# Patient Record
Sex: Female | Born: 1945
Health system: Southern US, Community
[De-identification: ages and names within clinical notes are randomized; demographics above are authoritative.]

## PROBLEM LIST (undated history)

## (undated) DIAGNOSIS — E785 Hyperlipidemia, unspecified: Secondary | ICD-10-CM

## (undated) DIAGNOSIS — M48061 Spinal stenosis, lumbar region without neurogenic claudication: Secondary | ICD-10-CM

## (undated) DIAGNOSIS — M542 Cervicalgia: Secondary | ICD-10-CM

## (undated) DIAGNOSIS — M4802 Spinal stenosis, cervical region: Secondary | ICD-10-CM

## (undated) DIAGNOSIS — E119 Type 2 diabetes mellitus without complications: Secondary | ICD-10-CM

## (undated) DIAGNOSIS — K219 Gastro-esophageal reflux disease without esophagitis: Secondary | ICD-10-CM

## (undated) DIAGNOSIS — K5792 Diverticulitis of intestine, part unspecified, without perforation or abscess without bleeding: Secondary | ICD-10-CM

## (undated) DIAGNOSIS — M62838 Other muscle spasm: Secondary | ICD-10-CM

## (undated) HISTORY — DX: Spinal stenosis, cervical region: M48.02

## (undated) HISTORY — DX: Type 2 diabetes mellitus without complications: E11.9

## (undated) HISTORY — DX: Diverticulitis of intestine, part unspecified, without perforation or abscess without bleeding: K57.92

## (undated) HISTORY — DX: Cervicalgia: M54.2

## (undated) HISTORY — DX: Other muscle spasm: M62.838

## (undated) HISTORY — PX: OVARIAN CYST REMOVAL: SHX89

## (undated) HISTORY — DX: Spinal stenosis, lumbar region without neurogenic claudication: M48.061

## (undated) HISTORY — DX: Hyperlipidemia, unspecified: E78.5

## (undated) HISTORY — DX: Gastro-esophageal reflux disease without esophagitis: K21.9

## (undated) HISTORY — PX: OTHER SURGICAL HISTORY: SHX169

---

## 1997-07-25 ENCOUNTER — Other Ambulatory Visit: Admission: RE | Admit: 1997-07-25 | Discharge: 1997-07-25 | Payer: Self-pay | Admitting: Obstetrics and Gynecology

## 1997-11-28 ENCOUNTER — Other Ambulatory Visit: Admission: RE | Admit: 1997-11-28 | Discharge: 1997-11-28 | Payer: Self-pay | Admitting: Obstetrics and Gynecology

## 1998-09-25 ENCOUNTER — Other Ambulatory Visit: Admission: RE | Admit: 1998-09-25 | Discharge: 1998-09-25 | Payer: Self-pay | Admitting: Obstetrics and Gynecology

## 1998-10-04 ENCOUNTER — Other Ambulatory Visit: Admission: RE | Admit: 1998-10-04 | Discharge: 1998-10-04 | Payer: Self-pay | Admitting: Obstetrics and Gynecology

## 1999-11-12 ENCOUNTER — Other Ambulatory Visit: Admission: RE | Admit: 1999-11-12 | Discharge: 1999-11-12 | Payer: Self-pay | Admitting: *Deleted

## 2000-02-21 ENCOUNTER — Ambulatory Visit (HOSPITAL_COMMUNITY): Admission: RE | Admit: 2000-02-21 | Discharge: 2000-02-21 | Payer: Self-pay | Admitting: Gastroenterology

## 2000-02-21 ENCOUNTER — Encounter: Payer: Self-pay | Admitting: Gastroenterology

## 2001-02-16 ENCOUNTER — Other Ambulatory Visit: Admission: RE | Admit: 2001-02-16 | Discharge: 2001-02-16 | Payer: Self-pay | Admitting: Family Medicine

## 2002-08-16 ENCOUNTER — Other Ambulatory Visit: Admission: RE | Admit: 2002-08-16 | Discharge: 2002-08-16 | Payer: Self-pay | Admitting: Family Medicine

## 2004-03-08 ENCOUNTER — Ambulatory Visit: Payer: Self-pay | Admitting: Family Medicine

## 2004-04-22 ENCOUNTER — Ambulatory Visit: Payer: Self-pay | Admitting: Family Medicine

## 2004-04-30 ENCOUNTER — Ambulatory Visit: Payer: Self-pay | Admitting: Family Medicine

## 2004-04-30 ENCOUNTER — Other Ambulatory Visit: Admission: RE | Admit: 2004-04-30 | Discharge: 2004-04-30 | Payer: Self-pay | Admitting: Family Medicine

## 2015-04-13 DIAGNOSIS — M6281 Muscle weakness (generalized): Secondary | ICD-10-CM | POA: Diagnosis not present

## 2015-04-13 DIAGNOSIS — M4722 Other spondylosis with radiculopathy, cervical region: Secondary | ICD-10-CM | POA: Diagnosis not present

## 2015-04-16 DIAGNOSIS — E119 Type 2 diabetes mellitus without complications: Secondary | ICD-10-CM | POA: Diagnosis not present

## 2015-04-16 DIAGNOSIS — E785 Hyperlipidemia, unspecified: Secondary | ICD-10-CM | POA: Diagnosis not present

## 2015-04-16 DIAGNOSIS — Z79899 Other long term (current) drug therapy: Secondary | ICD-10-CM | POA: Diagnosis not present

## 2015-04-16 DIAGNOSIS — E559 Vitamin D deficiency, unspecified: Secondary | ICD-10-CM | POA: Diagnosis not present

## 2015-04-17 DIAGNOSIS — M6281 Muscle weakness (generalized): Secondary | ICD-10-CM | POA: Diagnosis not present

## 2015-04-17 DIAGNOSIS — M4722 Other spondylosis with radiculopathy, cervical region: Secondary | ICD-10-CM | POA: Diagnosis not present

## 2015-04-19 DIAGNOSIS — M6281 Muscle weakness (generalized): Secondary | ICD-10-CM | POA: Diagnosis not present

## 2015-04-19 DIAGNOSIS — M4722 Other spondylosis with radiculopathy, cervical region: Secondary | ICD-10-CM | POA: Diagnosis not present

## 2015-04-23 DIAGNOSIS — E119 Type 2 diabetes mellitus without complications: Secondary | ICD-10-CM | POA: Diagnosis not present

## 2015-04-23 DIAGNOSIS — M2550 Pain in unspecified joint: Secondary | ICD-10-CM | POA: Diagnosis not present

## 2015-04-23 DIAGNOSIS — Z9181 History of falling: Secondary | ICD-10-CM | POA: Diagnosis not present

## 2015-04-23 DIAGNOSIS — Z1389 Encounter for screening for other disorder: Secondary | ICD-10-CM | POA: Diagnosis not present

## 2015-04-23 DIAGNOSIS — F39 Unspecified mood [affective] disorder: Secondary | ICD-10-CM | POA: Diagnosis not present

## 2015-04-23 DIAGNOSIS — I10 Essential (primary) hypertension: Secondary | ICD-10-CM | POA: Diagnosis not present

## 2015-04-23 DIAGNOSIS — Z681 Body mass index (BMI) 19 or less, adult: Secondary | ICD-10-CM | POA: Diagnosis not present

## 2015-04-23 DIAGNOSIS — E785 Hyperlipidemia, unspecified: Secondary | ICD-10-CM | POA: Diagnosis not present

## 2015-04-24 DIAGNOSIS — M4802 Spinal stenosis, cervical region: Secondary | ICD-10-CM | POA: Diagnosis not present

## 2015-04-24 DIAGNOSIS — M47812 Spondylosis without myelopathy or radiculopathy, cervical region: Secondary | ICD-10-CM | POA: Diagnosis not present

## 2015-04-24 DIAGNOSIS — M542 Cervicalgia: Secondary | ICD-10-CM | POA: Diagnosis not present

## 2015-05-03 DIAGNOSIS — Z23 Encounter for immunization: Secondary | ICD-10-CM | POA: Diagnosis not present

## 2015-05-07 ENCOUNTER — Encounter: Payer: Self-pay | Admitting: Neurology

## 2015-05-07 ENCOUNTER — Ambulatory Visit (INDEPENDENT_AMBULATORY_CARE_PROVIDER_SITE_OTHER): Payer: PPO | Admitting: Neurology

## 2015-05-07 VITALS — BP 138/73 | HR 82 | Ht 64.0 in | Wt 135.0 lb

## 2015-05-07 DIAGNOSIS — G243 Spasmodic torticollis: Secondary | ICD-10-CM

## 2015-05-07 NOTE — Progress Notes (Signed)
PATIENT: Cassandra Decker DOB: 01-05-1946  Chief Complaint  Patient presents with  . Neck Pain    Reports pain and spasms in her neck.  Pain radiates into the back of her head and into her bilateral shoulders.  She has been on multiple medications (meloxicam, hydrocodone, methocarbamol, duloxetine).  She is currently in physical therapy. She has tried epidural steroid injections and nerve blocks.     HISTORICAL  Cassandra Decker is a 70 years old right-handed female, alone at today's clinical visit, seen in refer by  Laroy Apple, MD her primary care Dr. Renelda Mom for evaluation of neck pain, abnormal neck posture  She had a history of hyperlipidemia, polymyalgia rheumatica, has been treated with prednisone 5 mg daily for few years, borderline diabetes, she works as Glass blower/designer for [employer]  Around 2010, she began to noticed neck discomfort, she tends to hold her neck to the right side, gradually getting worse, to the point of difficulty keeping her neck straight, tendency of head titubation, constant posterior neck pain, difficulty turning her neck to the left side, she also developed bilateral shoulder pain. She has difficulty driving, difficulty turning.  She has to hold her chin frequently to keep her neck in a straight position, she denies gait difficulty, no bowel and bladder incontinence.   She has gone through physical therapy, chiropractor, epidural injection without significant improvement   She had MRI of cervical spine at orthopedic clinic in August 2016, multilevel cervical degenerative changes, most severe at C5-6, with mild-to-moderate canal stenosis, no cord signal changes, was not able to reviewed the film   REVIEW OF SYSTEMS: Full 14 system review of systems performed and notable only foras above  ALLERGIES: Allergies  Allergen Reactions  . Aspirin Swelling  . Sulfa Antibiotics Hives    HOME MEDICATIONS: Current Outpatient Prescriptions  Medication  Sig Dispense Refill  . alendronate (FOSAMAX) 70 MG tablet Take 70 mg by mouth once a week. Take with a full glass of water on an empty stomach.    . Calcium Citrate-Vitamin D (CALCIUM CITRATE + D PO) Take by mouth daily.    . Cholecalciferol (VITAMIN D PO) Take by mouth daily.    . DULoxetine (CYMBALTA) 60 MG capsule Take 60 mg by mouth daily.    . Glucosamine-MSM-Hyaluronic Acd (JOINT HEALTH PO) Take by mouth.    Marland Kitchen HYDROcodone-acetaminophen (NORCO/VICODIN) 5-325 MG tablet Take 1 tablet by mouth as needed for moderate pain.    Marland Kitchen LACTOBACILLUS PO Take by mouth daily.    Marland Kitchen loratadine (CLARITIN) 10 MG tablet Take 10 mg by mouth as needed for allergies.    . LUTEIN PO Take by mouth daily.    Marland Kitchen MAGNESIUM PO Take by mouth daily.    . meloxicam (MOBIC) 7.5 MG tablet Take 7.5 mg by mouth as needed for pain.    . methocarbamol (ROBAXIN) 500 MG tablet Take 500 mg by mouth as needed for muscle spasms.    Marland Kitchen omeprazole (PRILOSEC) 20 MG capsule Take 20 mg by mouth daily.    . predniSONE (DELTASONE) 5 MG tablet Take 5 mg by mouth daily with breakfast.    . rosuvastatin (CRESTOR) 10 MG tablet Take 10 mg by mouth daily.    Marland Kitchen UNABLE TO FIND daily. Med Name: [place]     No current facility-administered medications for this visit.    PAST MEDICAL HISTORY: Past Medical History  Diagnosis Date  . Diabetes ([place])  Borderline - A1C 6.5  . Neck pain   . Muscle spasms of neck   . Cervical stenosis of spine     Past treament with ESI and nerve blocks.  . Lumbar stenosis     Past treatment with ESIs.  . Diverticulitis   . Hyperlipemia   . Acid reflux     PAST SURGICAL HISTORY: Past Surgical History  Procedure Laterality Date  . Ovarian cyst removal    . Colon surgery      Diverticulitis    FAMILY HISTORY: Family History  Problem Relation Age of Onset  . Stroke Mother   . Emphysema Father   . Cancer Paternal Grandfather     oral    SOCIAL HISTORY:  Social History    Social History  . Marital Status: Single    Spouse Name: N/A  . Number of Children: 2  . Years of Education: 14   Occupational History  . Glass blower/designer    Social History Main Topics  . Smoking status: Never Smoker   . Smokeless tobacco: Not on file  . Alcohol Use: No  . Drug Use: No  . Sexual Activity: Not on file   Other Topics Concern  . Not on file   Social History Narrative   Lives at home with husband.   Right-handed.   1-2 cup caffeine per day.     PHYSICAL EXAM   Filed Vitals:   05/07/15 1317  BP: 138/73  Pulse: 82  Height: 5\' 4"  (1.626 m)  Weight: 135 lb (61.236 kg)    Not recorded      Body mass index is 23.16 kg/(m^2).  PHYSICAL EXAMNIATION:  Gen: NAD, conversant, well nourised, obese, well groomed                     Cardiovascular: Regular rate rhythm, no peripheral edema, warm, nontender. Eyes: Conjunctivae clear without exudates or hemorrhage Neck: Supple, no carotid bruise. Pulmonary: Clear to auscultation bilaterally   NEUROLOGICAL EXAM:  MENTAL STATUS: Speech:    Speech is normal; fluent and spontaneous with normal comprehension.  Cognition:     Orientation to time, place and person     Normal recent and remote memory     Normal Attention span and concentration     Normal Language, naming, repeating,spontaneous speech     Fund of knowledge   CRANIAL NERVES: CN II: Visual fields are full to confrontation. Fundoscopic exam is normal with sharp discs and no vascular changes. Pupils are round equal and briskly reactive to light. CN III, IV, VI: extraocular movement are normal. No ptosis. CN V: Facial sensation is intact to pinprick in all 3 divisions bilaterally. Corneal responses are intact.  CN VII: Face is symmetric with normal eye closure and smile. CN VIII: Hearing is normal to rubbing fingers CN IX, X: Palate elevates symmetrically. Phonation is normal. CN XI: Head turning and shoulder shrug are intact CN XII: Tongue is  midline with normal movements and no atrophy.  MOTOR: She has constant head tilt movement, mild left shoulder elevation ,moderate anterocollis, right turn, left tilt   There is no pronator drift of out-stretched arms. Muscle bulk and tone are normal. Muscle strength is normal.  REFLEXES: Reflexes are 2+ and symmetric at the biceps, triceps, knees, and ankles. Plantar responses are flexor.  SENSORY: Intact to light touch, pinprick, position sense, and vibration sense are intact in fingers and toes.  COORDINATION: Rapid alternating movements and fine finger movements are  intact. There is no dysmetria on finger-to-nose and heel-knee-shin.    GAIT/STANCE: Posture is normal. Gait is steady with normal steps, base, arm swing, and turning. Heel and toe walking are normal. Tandem gait is normal.  Romberg is absent.   DIAGNOSTIC DATA (LABS, IMAGING, TESTING) - I reviewed patient records, labs, notes, testing and imaging myself where available.   ASSESSMENT AND PLAN  Kennidy Wyszynski Manfra is a 70 y.o. female    Cervical dystonia:  She has constant head yes yes titubation, mild left shoulder elevation ,moderate anterocollis, moderate right turn, left tilt   There was no evidence of cervical myelopathy  Bring MRI cervical CD to reveal  Preauthorization paperwork for EMG guided botulism toxin injection    Marcial Pacas, M.D. Ph.D.  [facility] [address] [place],  [address] Ph: [phone] Fax: [phone]  CC:Laroy Apple, MD. Dr. Nicholos Johns

## 2015-05-16 DIAGNOSIS — M6281 Muscle weakness (generalized): Secondary | ICD-10-CM | POA: Diagnosis not present

## 2015-05-16 DIAGNOSIS — M4722 Other spondylosis with radiculopathy, cervical region: Secondary | ICD-10-CM | POA: Diagnosis not present

## 2015-05-21 ENCOUNTER — Telehealth: Payer: Self-pay | Admitting: Neurology

## 2015-05-21 DIAGNOSIS — M6281 Muscle weakness (generalized): Secondary | ICD-10-CM | POA: Diagnosis not present

## 2015-05-21 DIAGNOSIS — M4722 Other spondylosis with radiculopathy, cervical region: Secondary | ICD-10-CM | POA: Diagnosis not present

## 2015-05-21 NOTE — Telephone Encounter (Signed)
[facility] called and needs to verify policy # . Please call and advise [phone] option 4 .

## 2015-05-23 DIAGNOSIS — M4722 Other spondylosis with radiculopathy, cervical region: Secondary | ICD-10-CM | POA: Diagnosis not present

## 2015-05-23 DIAGNOSIS — M6281 Muscle weakness (generalized): Secondary | ICD-10-CM | POA: Diagnosis not present

## 2015-05-31 ENCOUNTER — Telehealth: Payer: Self-pay | Admitting: Neurology

## 2015-05-31 NOTE — Telephone Encounter (Signed)
Pt called and wanted an update about her insurance coverage for botox injections. Please call and advise [phone]

## 2015-05-31 NOTE — Telephone Encounter (Signed)
Called and spoke to patient and relayed Cassandra Decker is out of the office and she should return on Monday March 6th . Patient relayed she was fine to wait . Patient relayed she is ready for her injection.

## 2015-06-04 DIAGNOSIS — Z1231 Encounter for screening mammogram for malignant neoplasm of breast: Secondary | ICD-10-CM | POA: Diagnosis not present

## 2015-06-04 DIAGNOSIS — M4722 Other spondylosis with radiculopathy, cervical region: Secondary | ICD-10-CM | POA: Diagnosis not present

## 2015-06-04 DIAGNOSIS — M6281 Muscle weakness (generalized): Secondary | ICD-10-CM | POA: Diagnosis not present

## 2015-06-06 DIAGNOSIS — M6281 Muscle weakness (generalized): Secondary | ICD-10-CM | POA: Diagnosis not present

## 2015-06-06 DIAGNOSIS — M4722 Other spondylosis with radiculopathy, cervical region: Secondary | ICD-10-CM | POA: Diagnosis not present

## 2015-06-06 NOTE — Telephone Encounter (Signed)
Patient has been scheduled

## 2015-06-08 DIAGNOSIS — Z6823 Body mass index (BMI) 23.0-23.9, adult: Secondary | ICD-10-CM | POA: Diagnosis not present

## 2015-06-08 DIAGNOSIS — J069 Acute upper respiratory infection, unspecified: Secondary | ICD-10-CM | POA: Diagnosis not present

## 2015-06-13 DIAGNOSIS — M4722 Other spondylosis with radiculopathy, cervical region: Secondary | ICD-10-CM | POA: Diagnosis not present

## 2015-06-13 DIAGNOSIS — M6281 Muscle weakness (generalized): Secondary | ICD-10-CM | POA: Diagnosis not present

## 2015-06-20 DIAGNOSIS — M4722 Other spondylosis with radiculopathy, cervical region: Secondary | ICD-10-CM | POA: Diagnosis not present

## 2015-06-20 DIAGNOSIS — M6281 Muscle weakness (generalized): Secondary | ICD-10-CM | POA: Diagnosis not present

## 2015-06-25 ENCOUNTER — Telehealth: Payer: Self-pay | Admitting: Neurology

## 2015-06-25 NOTE — Telephone Encounter (Signed)
Cassandra Decker with [facility] [phone] ext [phone] called said shipment for Xeomin could be shipped by tomorrow's appt . Said it cold be here by Wednesday 06/27/15. Please call asap!

## 2015-06-25 NOTE — Telephone Encounter (Signed)
Claiborne Billings with [facility] is calling and states she has questions about the patient in order to fill Rx Xeomin.  Please call.  Thanks!

## 2015-06-25 NOTE — Telephone Encounter (Signed)
Spoke with the pharmacy and they stated they have been trying to reach patient for consent.

## 2015-06-25 NOTE — Telephone Encounter (Signed)
Patient is calling and states that the pharmacy has called her and that they don't believe her medication will be here in time for her appointment tomorrow.  Please call patient [phone].

## 2015-06-26 ENCOUNTER — Ambulatory Visit (INDEPENDENT_AMBULATORY_CARE_PROVIDER_SITE_OTHER): Payer: PPO | Admitting: Neurology

## 2015-06-26 ENCOUNTER — Encounter: Payer: Self-pay | Admitting: Neurology

## 2015-06-26 VITALS — Ht 64.0 in

## 2015-06-26 DIAGNOSIS — G243 Spasmodic torticollis: Secondary | ICD-10-CM

## 2015-06-26 NOTE — Progress Notes (Signed)
**  Xeomin 100 units x 2 vials, Lot [id number], Exp 05/2016, [place] [id number], specialty pharmacy**mck,rn.

## 2015-06-26 NOTE — Telephone Encounter (Signed)
Spoke with pharmacy. It has been taken care of it.

## 2015-06-26 NOTE — Telephone Encounter (Signed)
Spoke with the patient and confirmed apt time.

## 2015-06-26 NOTE — Progress Notes (Signed)
PATIENT: Cassandra Decker DOB: Dec 17, 1945  Chief Complaint  Patient presents with  . Cervical Dystonia    Xeomin 200 units - specialty pharmacy     HISTORICAL  Cassandra Decker is a 70 years old right-handed female, alone at today's clinical visit, seen in refer by  Laroy Apple, her primary care Dr. Renelda Mom for evaluation of neck pain, abnormal neck posture  She had a history of hyperlipidemia, polymyalgia rheumatica, has been treated with prednisone 5 mg daily for few years, borderline diabetes, she works as Glass blower/designer for [employer]  Around 2010, she began to noticed neck discomfort, she tends to hold her neck to the right side, gradually getting worse, to the point of difficulty keeping her neck straight, tendency of head titubation, constant posterior neck pain, difficulty turning her neck to the left side, she also developed bilateral shoulder pain. She has difficulty driving, difficulty turning.  She has to hold her chin frequently to keep her neck in a straight position, she denies gait difficulty, no bowel and bladder incontinence.   She has gone through physical therapy, chiropractor, epidural injection without significant improvement   She had MRI of cervical spine at orthopedic clinic in August 2016, multilevel cervical degenerative changes, most severe at C5-6, with mild-to-moderate canal stenosis, no cord signal changes, was not able to reviewed the film  UPDATE March 28th 2017: We have personally reviewed MRI of cervical spine August 2016: Moderate multilevel cervical disc degeneration with moderate<BR>spinal stenosis at C5-6.  Mild spinal stenosis at C6-7 3. Mild-to-moderate multilevel foraminal stenosis as above.  Potential side effect of xeomin was explained to the patient, this is her first EMG guided xeomin injection   REVIEW OF SYSTEMS: Full 14 system review of systems performed and notable only foras above  ALLERGIES: Allergies  Allergen Reactions    . Aspirin Swelling  . Sulfa Antibiotics Hives    HOME MEDICATIONS: Current Outpatient Prescriptions  Medication Sig Dispense Refill  . alendronate (FOSAMAX) 70 MG tablet Take 70 mg by mouth once a week. Take with a full glass of water on an empty stomach.    . Calcium Citrate-Vitamin D (CALCIUM CITRATE + D PO) Take by mouth daily.    . Cholecalciferol (VITAMIN D PO) Take by mouth daily.    . DULoxetine (CYMBALTA) 60 MG capsule Take 60 mg by mouth daily.    . Glucosamine-MSM-Hyaluronic Acd (JOINT HEALTH PO) Take by mouth.    Marland Kitchen HYDROcodone-acetaminophen (NORCO/VICODIN) 5-325 MG tablet Take 1 tablet by mouth as needed for moderate pain.    Marland Kitchen LACTOBACILLUS PO Take by mouth daily.    Marland Kitchen loratadine (CLARITIN) 10 MG tablet Take 10 mg by mouth as needed for allergies.    . LUTEIN PO Take by mouth daily.    Marland Kitchen MAGNESIUM PO Take by mouth daily.    . meloxicam (MOBIC) 7.5 MG tablet Take 7.5 mg by mouth as needed for pain.    . methocarbamol (ROBAXIN) 500 MG tablet Take 500 mg by mouth as needed for muscle spasms.    Marland Kitchen omeprazole (PRILOSEC) 20 MG capsule Take 20 mg by mouth daily.    . predniSONE (DELTASONE) 5 MG tablet Take 5 mg by mouth daily with breakfast.    . rosuvastatin (CRESTOR) 10 MG tablet Take 10 mg by mouth daily.    Marland Kitchen UNABLE TO FIND daily. Med Name: [place]     No current facility-administered medications for this visit.    PAST  MEDICAL HISTORY: Past Medical History  Diagnosis Date  . Diabetes ([facility])     Borderline - A1C 6.5  . Neck pain   . Muscle spasms of neck   . Cervical stenosis of spine     Past treament with ESI and nerve blocks.  . Lumbar stenosis     Past treatment with ESIs.  . Diverticulitis   . Hyperlipemia   . Acid reflux     PAST SURGICAL HISTORY: Past Surgical History  Procedure Laterality Date  . Ovarian cyst removal    . Colon surgery      Diverticulitis    FAMILY HISTORY: Family History  Problem Relation Age of Onset  . Stroke  Mother   . Emphysema Father   . Cancer Paternal Grandfather     oral    SOCIAL HISTORY:  Social History   Social History  . Marital Status: Single    Spouse Name: N/A  . Number of Children: 2  . Years of Education: 14   Occupational History  . Glass blower/designer    Social History Main Topics  . Smoking status: Never Smoker   . Smokeless tobacco: Not on file  . Alcohol Use: No  . Drug Use: No  . Sexual Activity: Not on file   Other Topics Concern  . Not on file   Social History Narrative   Lives at home with husband.   Right-handed.   1-2 cup caffeine per day.     PHYSICAL EXAM   Filed Vitals:   06/26/15 0746  Height: 5\' 4"  (1.626 m)    Not recorded      There is no weight on file to calculate BMI.  PHYSICAL EXAMNIATION:  Gen: NAD, conversant, well nourised, obese, well groomed                     Cardiovascular: Regular rate rhythm, no peripheral edema, warm, nontender. Eyes: Conjunctivae clear without exudates or hemorrhage Neck: Supple, no carotid bruise. Pulmonary: Clear to auscultation bilaterally   NEUROLOGICAL EXAM:  MENTAL STATUS: Speech:    Speech is normal; fluent and spontaneous with normal comprehension.  Cognition:     Orientation to time, place and person     Normal recent and remote memory     Normal Attention span and concentration     Normal Language, naming, repeating,spontaneous speech     Fund of knowledge   CRANIAL NERVES: CN II: Visual fields are full to confrontation. Fundoscopic exam is normal with sharp discs and no vascular changes. Pupils are round equal and briskly reactive to light. CN III, IV, VI: extraocular movement are normal. No ptosis. CN V: Facial sensation is intact to pinprick in all 3 divisions bilaterally. Corneal responses are intact.  CN VII: Face is symmetric with normal eye closure and smile. CN VIII: Hearing is normal to rubbing fingers CN IX, X: Palate elevates symmetrically. Phonation is normal. CN  XI: Head turning and shoulder shrug are intact CN XII: Tongue is midline with normal movements and no atrophy.  MOTOR: She has constant head tilt movement, mild left shoulder elevation ,moderate anterocollis, right turn, right tilt  There is no pronator drift of out-stretched arms. Muscle bulk and tone are normal. Muscle strength is normal.  REFLEXES: Reflexes are 2+ and symmetric at the biceps, triceps, knees, and ankles. Plantar responses are flexor.  SENSORY: Intact to light touch, pinprick, position sense, and vibration sense are intact in fingers and toes.  COORDINATION: Rapid alternating  movements and fine finger movements are intact. There is no dysmetria on finger-to-nose and heel-knee-shin.    GAIT/STANCE: Posture is normal. Gait is steady with normal steps, base, arm swing, and turning. Heel and toe walking are normal. Tandem gait is normal.  Romberg is absent.   DIAGNOSTIC DATA (LABS, IMAGING, TESTING) - I reviewed patient records, labs, notes, testing and imaging myself where available.   ASSESSMENT AND PLAN  Cassandra Decker is a 70 y.o. female    Cervical dystonia: She has constant head yes yes titubation, mild left shoulder elevation ,moderate anterocollis, moderate right turn, right tilt This is her first EMG guided xeomin injection in March 28th 2017  Used Xeomin 200 units (100units/2cc NS) under EMG guidance  Left sternocleidomastoid 25 units Left levator scapular 25 units  Right longissimus 25 units Right splenius capitis 25 units Right splenius cervix 25 units  Right iliocostalis 25 units Right semispinalis 25 units Right scalenes posterior 25 units  She tolerated the injection well, Will increase her xeomin injection to 300 units in next injection in June 2017    Marcial Pacas, M.D. Ph.D.  [facility] [address] [place], [place] [address] Ph: [phone] Fax: [phone]  CC:Laroy Apple, MD. Dr. Nicholos Johns

## 2015-07-04 DIAGNOSIS — M4722 Other spondylosis with radiculopathy, cervical region: Secondary | ICD-10-CM | POA: Diagnosis not present

## 2015-07-04 DIAGNOSIS — M6281 Muscle weakness (generalized): Secondary | ICD-10-CM | POA: Diagnosis not present

## 2015-07-11 DIAGNOSIS — M6281 Muscle weakness (generalized): Secondary | ICD-10-CM | POA: Diagnosis not present

## 2015-07-11 DIAGNOSIS — M4722 Other spondylosis with radiculopathy, cervical region: Secondary | ICD-10-CM | POA: Diagnosis not present

## 2015-08-21 DIAGNOSIS — I10 Essential (primary) hypertension: Secondary | ICD-10-CM | POA: Diagnosis not present

## 2015-08-21 DIAGNOSIS — E785 Hyperlipidemia, unspecified: Secondary | ICD-10-CM | POA: Diagnosis not present

## 2015-08-21 DIAGNOSIS — M353 Polymyalgia rheumatica: Secondary | ICD-10-CM | POA: Diagnosis not present

## 2015-08-21 DIAGNOSIS — N951 Menopausal and female climacteric states: Secondary | ICD-10-CM | POA: Diagnosis not present

## 2015-08-21 DIAGNOSIS — F39 Unspecified mood [affective] disorder: Secondary | ICD-10-CM | POA: Diagnosis not present

## 2015-08-21 DIAGNOSIS — M5440 Lumbago with sciatica, unspecified side: Secondary | ICD-10-CM | POA: Diagnosis not present

## 2015-09-27 ENCOUNTER — Ambulatory Visit (INDEPENDENT_AMBULATORY_CARE_PROVIDER_SITE_OTHER): Payer: PPO | Admitting: Neurology

## 2015-09-27 ENCOUNTER — Telehealth: Payer: Self-pay | Admitting: *Deleted

## 2015-09-27 ENCOUNTER — Encounter: Payer: Self-pay | Admitting: Neurology

## 2015-09-27 VITALS — BP 126/74 | HR 60 | Ht 64.0 in

## 2015-09-27 DIAGNOSIS — G243 Spasmodic torticollis: Secondary | ICD-10-CM | POA: Diagnosis not present

## 2015-09-27 NOTE — Progress Notes (Signed)
**  Xeomin 100 units x 2 vials, Lot [id number], Exp 05/2017, [place] [id number], specialty pharmacy.**mck,rn.

## 2015-09-27 NOTE — Progress Notes (Signed)
PATIENT: Cassandra Decker DOB: 1945/05/21  Chief Complaint  Patient presents with  . Cervical Dystonia    Xeomin 300 units - specialty pharmacy     HISTORICAL  Cassandra Decker is a 70 years old right-handed female, alone at today's clinical visit, seen in refer by  Laroy Apple, her primary care Dr. Renelda Mom for evaluation of neck pain, abnormal neck posture  She had a history of hyperlipidemia, polymyalgia rheumatica, has been treated with prednisone 5 mg daily for few years, borderline diabetes, she works as Glass blower/designer for [employer]  Around 2010, she began to noticed neck discomfort, she tends to hold her neck to the right side, gradually getting worse, to the point of difficulty keeping her neck straight, tendency of head titubation, constant posterior neck pain, difficulty turning her neck to the left side, she also developed bilateral shoulder pain. She has difficulty driving, difficulty turning.  She has to hold her chin frequently to keep her neck in a straight position, she denies gait difficulty, no bowel and bladder incontinence.   She has gone through physical therapy, chiropractor, epidural injection without significant improvement   She had MRI of cervical spine at orthopedic clinic in August 2016, multilevel cervical degenerative changes, most severe at C5-6, with mild-to-moderate canal stenosis, no cord signal changes, was not able to reviewed the film  UPDATE March 28th 2017: We have personally reviewed MRI of cervical spine August 2016: Moderate multilevel cervical disc degeneration with moderate<BR>spinal stenosis at C5-6.  Mild spinal stenosis at C6-7 3. Mild-to-moderate multilevel foraminal stenosis as above.  Potential side effect of xeomin was explained to the patient, this is her first EMG guided xeomin injection  UPDATE June 29th 2017: She responded to her initial xeomin injection in March 28 very well, we used 200 units of xeomin, no significant  side effect, took about 2 weeks to take effect, the benefit lasts about 10 weeks, in the past 2 weeks, she noticed recurrent symptoms, neck pulling to her right side   REVIEW OF SYSTEMS: Full 14 system review of systems performed and notable only foras above  ALLERGIES: Allergies  Allergen Reactions  . Aspirin Swelling  . Sulfa Antibiotics Hives    HOME MEDICATIONS: Current Outpatient Prescriptions  Medication Sig Dispense Refill  . alendronate (FOSAMAX) 70 MG tablet Take 70 mg by mouth once a week. Take with a full glass of water on an empty stomach.    . Calcium Citrate-Vitamin D (CALCIUM CITRATE + D PO) Take by mouth daily.    . Cholecalciferol (VITAMIN D PO) Take by mouth daily.    . DULoxetine (CYMBALTA) 60 MG capsule Take 60 mg by mouth daily.    . Glucosamine-MSM-Hyaluronic Acd (JOINT HEALTH PO) Take by mouth.    Marland Kitchen HYDROcodone-acetaminophen (NORCO/VICODIN) 5-325 MG tablet Take 1 tablet by mouth as needed for moderate pain.    Marland Kitchen LACTOBACILLUS PO Take by mouth daily.    Marland Kitchen loratadine (CLARITIN) 10 MG tablet Take 10 mg by mouth as needed for allergies.    . LUTEIN PO Take by mouth daily.    Marland Kitchen MAGNESIUM PO Take by mouth daily.    . meloxicam (MOBIC) 7.5 MG tablet Take 7.5 mg by mouth as needed for pain.    . methocarbamol (ROBAXIN) 500 MG tablet Take 500 mg by mouth as needed for muscle spasms.    Marland Kitchen omeprazole (PRILOSEC) 20 MG capsule Take 20 mg by mouth daily.    . predniSONE (DELTASONE)  5 MG tablet Take 5 mg by mouth daily with breakfast.    . rosuvastatin (CRESTOR) 10 MG tablet Take 10 mg by mouth daily.    Marland Kitchen UNABLE TO FIND daily. Med Name: [place]     No current facility-administered medications for this visit.    PAST MEDICAL HISTORY: Past Medical History  Diagnosis Date  . Diabetes (HCC)     Borderline - A1C 6.5  . Neck pain   . Muscle spasms of neck   . Cervical stenosis of spine     Past treament with ESI and nerve blocks.  . Lumbar stenosis      Past treatment with ESIs.  . Diverticulitis   . Hyperlipemia   . Acid reflux     PAST SURGICAL HISTORY: Past Surgical History  Procedure Laterality Date  . Ovarian cyst removal    . Colon surgery      Diverticulitis    FAMILY HISTORY: Family History  Problem Relation Age of Onset  . Stroke Mother   . Emphysema Father   . Cancer Paternal Grandfather     oral    SOCIAL HISTORY:  Social History   Social History  . Marital Status: Single    Spouse Name: N/A  . Number of Children: 2  . Years of Education: 14   Occupational History  . Glass blower/designer    Social History Main Topics  . Smoking status: Never Smoker   . Smokeless tobacco: Not on file  . Alcohol Use: No  . Drug Use: No  . Sexual Activity: Not on file   Other Topics Concern  . Not on file   Social History Narrative   Lives at home with husband.   Right-handed.   1-2 cup caffeine per day.     PHYSICAL EXAM   Filed Vitals:   09/27/15 1136  Height: 5\' 4"  (1.626 m)    Not recorded      There is no weight on file to calculate BMI.  PHYSICAL EXAMNIATION:  Gen: NAD, conversant, well nourised, obese, well groomed                     Cardiovascular: Regular rate rhythm, no peripheral edema, warm, nontender. Eyes: Conjunctivae clear without exudates or hemorrhage Neck: Supple, no carotid bruise. Pulmonary: Clear to auscultation bilaterally   NEUROLOGICAL EXAM:  MENTAL STATUS: Speech:    Speech is normal; fluent and spontaneous with normal comprehension.  Cognition:     Orientation to time, place and person     Normal recent and remote memory     Normal Attention span and concentration     Normal Language, naming, repeating,spontaneous speech     Fund of knowledge   CRANIAL NERVES: CN II: Visual fields are full to confrontation. Fundoscopic exam is normal with sharp discs and no vascular changes. Pupils are round equal and briskly reactive to light. CN III, IV, VI: extraocular movement  are normal. No ptosis. CN V: Facial sensation is intact to pinprick in all 3 divisions bilaterally. Corneal responses are intact.  CN VII: Face is symmetric with normal eye closure and smile. CN VIII: Hearing is normal to rubbing fingers CN IX, X: Palate elevates symmetrically. Phonation is normal. CN XI: Head turning and shoulder shrug are intact CN XII: Tongue is midline with normal movements and no atrophy.  MOTOR: Her chin was pulling to her right shoulder, with moderate anterocollis, left tilt, right turn, mild left shoulder elevation.  There is no pronator drift of out-stretched arms. Muscle bulk and tone are normal. Muscle strength is normal.  REFLEXES: Reflexes are 2+ and symmetric at the biceps, triceps, knees, and ankles. Plantar responses are flexor.  SENSORY: Intact to light touch, pinprick, position sense, and vibration sense are intact in fingers and toes.  COORDINATION: Rapid alternating movements and fine finger movements are intact. There is no dysmetria on finger-to-nose and heel-knee-shin.    GAIT/STANCE: Posture is normal. Gait is steady with normal steps, base, arm swing, and turning. Heel and toe walking are normal. Tandem gait is normal.  Romberg is absent.   DIAGNOSTIC DATA (LABS, IMAGING, TESTING) - I reviewed patient records, labs, notes, testing and imaging myself where available.   ASSESSMENT AND PLAN  Cassandra Decker is a 70 y.o. female    Cervical dystonia: She has constant head yes yes titubation, mild left shoulder elevation ,moderate anterocollis, moderate right turn, right tilt This is her first EMG guided xeomin injection in March 28th 2017  Used Xeomin 200 units (100units/2cc NS) under EMG guidance  Left sternocleidomastoid 25 unitsx2=50 units Left levator scapular 25 units   Right longissimus 25 units Right splenius capitis 25 units Right splenius cervix 25 units Right levator scapular 25 units Right semispinalis 20 units  Under  EMG guidance also did small amount 2.5 units to right orbicularis oculi, 2.5 units to left orbicularis oculi to decrease muscle fasciculations  She tolerated the injection well, Will increase her xeomin injection to 300 units in next injection in Sept 2017    Marcial Pacas, M.D. Ph.D.  [facility] [address] [place], [place] [address] Ph: [phone] Fax: [phone]  CC:Laroy Apple, MD. Dr. Nicholos Johns

## 2015-09-27 NOTE — Progress Notes (Deleted)
Subjective:    Patient ID: Cassandra Decker is a 70 y.o. female.  HPI {Common ambulatory SmartLinks:[id number]}  Review of Systems  Objective:  Neurologic Exam  Physical Exam  Assessment:   ***  Plan:   ***

## 2015-09-27 NOTE — Telephone Encounter (Signed)
Patient's next injection in 11/2015 will be for Xeomin 300 units.

## 2015-10-03 DIAGNOSIS — I10 Essential (primary) hypertension: Secondary | ICD-10-CM | POA: Diagnosis not present

## 2015-10-03 DIAGNOSIS — J209 Acute bronchitis, unspecified: Secondary | ICD-10-CM | POA: Diagnosis not present

## 2015-10-03 DIAGNOSIS — J029 Acute pharyngitis, unspecified: Secondary | ICD-10-CM | POA: Diagnosis not present

## 2015-10-03 DIAGNOSIS — J019 Acute sinusitis, unspecified: Secondary | ICD-10-CM | POA: Diagnosis not present

## 2015-11-19 DIAGNOSIS — E119 Type 2 diabetes mellitus without complications: Secondary | ICD-10-CM | POA: Diagnosis not present

## 2015-11-19 DIAGNOSIS — E785 Hyperlipidemia, unspecified: Secondary | ICD-10-CM | POA: Diagnosis not present

## 2015-11-19 DIAGNOSIS — E559 Vitamin D deficiency, unspecified: Secondary | ICD-10-CM | POA: Diagnosis not present

## 2015-11-19 DIAGNOSIS — Z79899 Other long term (current) drug therapy: Secondary | ICD-10-CM | POA: Diagnosis not present

## 2015-11-27 DIAGNOSIS — M2550 Pain in unspecified joint: Secondary | ICD-10-CM | POA: Diagnosis not present

## 2015-11-27 DIAGNOSIS — M5440 Lumbago with sciatica, unspecified side: Secondary | ICD-10-CM | POA: Diagnosis not present

## 2015-11-27 DIAGNOSIS — I10 Essential (primary) hypertension: Secondary | ICD-10-CM | POA: Diagnosis not present

## 2015-11-27 DIAGNOSIS — M353 Polymyalgia rheumatica: Secondary | ICD-10-CM | POA: Diagnosis not present

## 2015-11-27 DIAGNOSIS — E785 Hyperlipidemia, unspecified: Secondary | ICD-10-CM | POA: Diagnosis not present

## 2015-11-27 DIAGNOSIS — E119 Type 2 diabetes mellitus without complications: Secondary | ICD-10-CM | POA: Diagnosis not present

## 2015-11-27 DIAGNOSIS — L989 Disorder of the skin and subcutaneous tissue, unspecified: Secondary | ICD-10-CM | POA: Diagnosis not present

## 2015-12-04 ENCOUNTER — Telehealth: Payer: Self-pay | Admitting: Neurology

## 2015-12-04 NOTE — Telephone Encounter (Signed)
Sarah with [facility] is calling to get a refill for medication Xeomin 100 units for the patient.

## 2015-12-07 NOTE — Telephone Encounter (Signed)
Called and gave verbal to pharmacist for Xeomin  For 300 units . Relayed Patient's apt October 5th . Pharmacy will contact patient and contact us back after they speak to patient to set up delivery of Xeomin.

## 2015-12-13 DIAGNOSIS — M1712 Unilateral primary osteoarthritis, left knee: Secondary | ICD-10-CM | POA: Diagnosis not present

## 2015-12-13 DIAGNOSIS — M1711 Unilateral primary osteoarthritis, right knee: Secondary | ICD-10-CM | POA: Diagnosis not present

## 2015-12-14 NOTE — Telephone Encounter (Signed)
[facility] [phone] called to set up delivery of Xeomin

## 2015-12-18 NOTE — Telephone Encounter (Signed)
Xeomin TBD 12/19/2015.

## 2016-01-03 ENCOUNTER — Encounter: Payer: Self-pay | Admitting: Neurology

## 2016-01-03 ENCOUNTER — Telehealth: Payer: Self-pay | Admitting: Neurology

## 2016-01-03 ENCOUNTER — Ambulatory Visit (INDEPENDENT_AMBULATORY_CARE_PROVIDER_SITE_OTHER): Payer: PPO | Admitting: Neurology

## 2016-01-03 VITALS — BP 140/80 | HR 88 | Ht 64.0 in | Wt 141.5 lb

## 2016-01-03 DIAGNOSIS — G243 Spasmodic torticollis: Secondary | ICD-10-CM

## 2016-01-03 NOTE — Telephone Encounter (Signed)
Pt need xeomin in Jan 2018. Her auth ends 03/30/16. Will we contact her ins for 2018? cb

## 2016-01-03 NOTE — Progress Notes (Signed)
PATIENT: Cassandra Decker DOB: February 12, 1946  Chief Complaint  Patient presents with  . Cervical Dystonia    Xeomin 300 units - specialty pharmacy.     HISTORICAL  Cassandra Decker is a 70 years old right-handed female, alone at today's clinical visit, seen in refer by  Laroy Apple, her primary care Dr. Renelda Mom for evaluation of neck pain, abnormal neck posture  She had a history of hyperlipidemia, polymyalgia rheumatica, has been treated with prednisone 5 mg daily for few years, borderline diabetes, she works as Glass blower/designer for [employer]  Around 2010, she began to noticed neck discomfort, she tends to hold her neck to the right side, gradually getting worse, to the point of difficulty keeping her neck straight, tendency of head titubation, constant posterior neck pain, difficulty turning her neck to the left side, she also developed bilateral shoulder pain. She has difficulty driving, difficulty turning.  She has to hold her chin frequently to keep her neck in a straight position, she denies gait difficulty, no bowel and bladder incontinence.   She has gone through physical therapy, chiropractor, epidural injection without significant improvement   She had MRI of cervical spine at orthopedic clinic in August 2016, multilevel cervical degenerative changes, most severe at C5-6, with mild-to-moderate canal stenosis, no cord signal changes, was not able to reviewed the film  UPDATE March 28th 2017: We have personally reviewed MRI of cervical spine August 2016: Moderate multilevel cervical disc degeneration with moderate<BR>spinal stenosis at C5-6.  Mild spinal stenosis at C6-7 3. Mild-to-moderate multilevel foraminal stenosis as above.  Potential side effect of xeomin was explained to the patient, this is her first EMG guided xeomin injection  UPDATE June 29th 2017: She responded to her initial xeomin injection in March 28 very well, we used 200 units of xeomin, no significant  side effect, took about 2 weeks to take effect, the benefit lasts about 10 weeks, in the past 2 weeks, she noticed recurrent symptoms, neck pulling to her right side   UPDATE Oct 5th 2017: She received 200 units of xeomin had previous injection on September 27 2015, but she got flulike illness shortly afterwards, she did notice worsening right-sided neck pain, pulling of her neck at the end of 3 months.   REVIEW OF SYSTEMS: Full 14 system review of systems performed and notable only foras above  ALLERGIES: Allergies  Allergen Reactions  . Aspirin Swelling  . Sulfa Antibiotics Hives    HOME MEDICATIONS: Current Outpatient Prescriptions  Medication Sig Dispense Refill  . alendronate (FOSAMAX) 70 MG tablet Take 70 mg by mouth once a week. Take with a full glass of water on an empty stomach.    . baclofen (LIORESAL) 10 MG tablet Take 10 mg by mouth as needed for muscle spasms.    . Cholecalciferol (VITAMIN D3) 5000 units CAPS Take by mouth daily.    . cloNIDine (CATAPRES) 0.1 MG tablet Take 0.1 mg by mouth daily.     . Coenzyme Q10 (COQ10 PO) Take by mouth daily.    . DULoxetine (CYMBALTA) 60 MG capsule Take 60 mg by mouth daily.    Marland Kitchen FIBER COMPLETE TABS Take by mouth. Chew 2-3 tablets daily.    Marland Kitchen HYDROcodone-acetaminophen (NORCO/VICODIN) 5-325 MG tablet Take 1 tablet by mouth as needed for moderate pain.    Marland Kitchen loratadine (CLARITIN) 10 MG tablet Take 10 mg by mouth as needed for allergies.    . meloxicam (MOBIC) 15 MG tablet Take  15 mg by mouth daily.    . metaxalone (SKELAXIN) 800 MG tablet Take 800 mg by mouth as needed for muscle spasms.    . Multiple Vitamins-Minerals (MULTIVITAMIN PO) Take by mouth. Take a multivitamin powder twice daily (calcium 400mg , magnesium 200mg , vitamin C 500mg ).    . Multiple Vitamins-Minerals (MULTIVITAMIN WITH MINERALS) tablet Take 1 tablet by mouth daily.    Marland Kitchen omeprazole (PRILOSEC) 20 MG capsule Take 20 mg by mouth daily.    . rosuvastatin (CRESTOR) 10 MG tablet  Take 10 mg by mouth daily.     No current facility-administered medications for this visit.     PAST MEDICAL HISTORY: Past Medical History:  Diagnosis Date  . Acid reflux   . Cervical stenosis of spine    Past treament with ESI and nerve blocks.  . Diabetes ([place])    Borderline - A1C 6.5  . Diverticulitis   . Hyperlipemia   . Lumbar stenosis    Past treatment with ESIs.  . Muscle spasms of neck   . Neck pain     PAST SURGICAL HISTORY: Past Surgical History:  Procedure Laterality Date  . colon surgery     Diverticulitis  . OVARIAN CYST REMOVAL      FAMILY HISTORY: Family History  Problem Relation Age of Onset  . Stroke Mother   . Emphysema Father   . Cancer Paternal Grandfather     oral    SOCIAL HISTORY:  Social History   Social History  . Marital status: Single    Spouse name: N/A  . Number of children: 2  . Years of education: 14   Occupational History  . Glass blower/designer    Social History Main Topics  . Smoking status: Never Smoker  . Smokeless tobacco: Not on file  . Alcohol use No  . Drug use: No  . Sexual activity: Not on file   Other Topics Concern  . Not on file   Social History Narrative   Lives at home with husband.   Right-handed.   1-2 cup caffeine per day.     PHYSICAL EXAM   Vitals:   01/03/16 1233  BP: 140/80  Pulse: 88  Weight: 141 lb 8 oz (64.2 kg)  Height: 5\' 4"  (1.626 m)    Not recorded      Body mass index is 24.29 kg/m.  PHYSICAL EXAMNIATION:  Gen: NAD, conversant, well nourised, obese, well groomed                     Cardiovascular: Regular rate rhythm, no peripheral edema, warm, nontender. Eyes: Conjunctivae clear without exudates or hemorrhage Neck: Supple, no carotid bruise. Pulmonary: Clear to auscultation bilaterally   NEUROLOGICAL EXAM:  MENTAL STATUS: Speech:    Speech is normal; fluent and spontaneous with normal comprehension.  Cognition:     Orientation to time, place and person     Normal  recent and remote memory     Normal Attention span and concentration     Normal Language, naming, repeating,spontaneous speech     Fund of knowledge   CRANIAL NERVES: CN II: Visual fields are full to confrontation. Fundoscopic exam is normal with sharp discs and no vascular changes. Pupils are round equal and briskly reactive to light. CN III, IV, VI: extraocular movement are normal. No ptosis. CN V: Facial sensation is intact to pinprick in all 3 divisions bilaterally. Corneal responses are intact.  CN VII: Face is symmetric with normal eye closure and  smile. CN VIII: Hearing is normal to rubbing fingers CN IX, X: Palate elevates symmetrically. Phonation is normal. CN XI: Head turning and shoulder shrug are intact CN XII: Tongue is midline with normal movements and no atrophy.  MOTOR: Her chin was pulling to her right shoulder, with moderate anterocollis, right turn, mild left shoulder elevation. Mild neck extension weakness    There is no pronator drift of out-stretched arms. Muscle bulk and tone are normal. Muscle strength is normal.  REFLEXES: Reflexes are 2+ and symmetric at the biceps, triceps, knees, and ankles. Plantar responses are flexor.  SENSORY: Intact to light touch, pinprick, position sense, and vibration sense are intact in fingers and toes.  COORDINATION: Rapid alternating movements and fine finger movements are intact. There is no dysmetria on finger-to-nose and heel-knee-shin.    GAIT/STANCE: Posture is normal. Gait is steady with normal steps, base, arm swing, and turning. Heel and toe walking are normal. Tandem gait is normal.  Romberg is absent.   DIAGNOSTIC DATA (LABS, IMAGING, TESTING) - I reviewed patient records, labs, notes, testing and imaging myself where available.   ASSESSMENT AND PLAN  Cassandra Decker is a 70 y.o. female    Cervical dystonia: She has occasionally head yes yes titubation, Her chin was pulling to her right shoulder, with  moderate anterocollis, right turn, mild left shoulder elevation.  Her first EMG guided xeomin injection in March 28th 2017  Used Xeomin 300 units (100units/2cc NS) under EMG guidance  Left sternocleidomastoid 25 units  Left levator scapular 25 units   Right longissimus 25 x2= 50 units Right splenius capitis 25 units Right splenius cervix 25 units Right levator scapular 25 units Right semispinalis 25 units  Right illiocostalis 25 units Right upper trapezius 25 units  Right posterior scales 25 units Right medial scales 25 units  She tolerated the injection well,     Marcial Pacas, M.D. Ph.D.  [facility] [address] [place], [place] [address] Ph: [phone] Fax: [phone]  CC:Laroy Apple, MD. Dr. Nicholos Johns

## 2016-01-03 NOTE — Progress Notes (Signed)
**  Xeomin 100 units x 3 vials, Lot [id number], Exp 05/2017, [place] [id number], [facility].**

## 2016-01-14 DIAGNOSIS — M5416 Radiculopathy, lumbar region: Secondary | ICD-10-CM | POA: Diagnosis not present

## 2016-01-15 DIAGNOSIS — H2513 Age-related nuclear cataract, bilateral: Secondary | ICD-10-CM | POA: Diagnosis not present

## 2016-01-15 DIAGNOSIS — E119 Type 2 diabetes mellitus without complications: Secondary | ICD-10-CM | POA: Diagnosis not present

## 2016-01-22 DIAGNOSIS — D2239 Melanocytic nevi of other parts of face: Secondary | ICD-10-CM | POA: Diagnosis not present

## 2016-01-22 DIAGNOSIS — D485 Neoplasm of uncertain behavior of skin: Secondary | ICD-10-CM | POA: Diagnosis not present

## 2016-01-22 DIAGNOSIS — L739 Follicular disorder, unspecified: Secondary | ICD-10-CM | POA: Diagnosis not present

## 2016-01-22 DIAGNOSIS — R233 Spontaneous ecchymoses: Secondary | ICD-10-CM | POA: Diagnosis not present

## 2016-01-24 DIAGNOSIS — M545 Low back pain: Secondary | ICD-10-CM | POA: Diagnosis not present

## 2016-02-04 DIAGNOSIS — M47816 Spondylosis without myelopathy or radiculopathy, lumbar region: Secondary | ICD-10-CM | POA: Diagnosis not present

## 2016-02-26 DIAGNOSIS — M47816 Spondylosis without myelopathy or radiculopathy, lumbar region: Secondary | ICD-10-CM | POA: Diagnosis not present

## 2016-02-27 DIAGNOSIS — J309 Allergic rhinitis, unspecified: Secondary | ICD-10-CM | POA: Diagnosis not present

## 2016-02-27 DIAGNOSIS — E119 Type 2 diabetes mellitus without complications: Secondary | ICD-10-CM | POA: Diagnosis not present

## 2016-02-27 DIAGNOSIS — M8580 Other specified disorders of bone density and structure, unspecified site: Secondary | ICD-10-CM | POA: Diagnosis not present

## 2016-02-27 DIAGNOSIS — M2550 Pain in unspecified joint: Secondary | ICD-10-CM | POA: Diagnosis not present

## 2016-02-27 DIAGNOSIS — I10 Essential (primary) hypertension: Secondary | ICD-10-CM | POA: Diagnosis not present

## 2016-02-27 DIAGNOSIS — Z23 Encounter for immunization: Secondary | ICD-10-CM | POA: Diagnosis not present

## 2016-02-29 ENCOUNTER — Telehealth: Payer: Self-pay | Admitting: Neurology

## 2016-02-29 NOTE — Telephone Encounter (Signed)
Pt botox appt 04/17/15 needs r/s due to Dr Krista Blue out on jury duty. Please r/s botox appt.

## 2016-03-12 DIAGNOSIS — M67442 Ganglion, left hand: Secondary | ICD-10-CM | POA: Diagnosis not present

## 2016-03-19 DIAGNOSIS — R509 Fever, unspecified: Secondary | ICD-10-CM | POA: Diagnosis not present

## 2016-03-19 DIAGNOSIS — Z6824 Body mass index (BMI) 24.0-24.9, adult: Secondary | ICD-10-CM | POA: Diagnosis not present

## 2016-03-19 DIAGNOSIS — J069 Acute upper respiratory infection, unspecified: Secondary | ICD-10-CM | POA: Diagnosis not present

## 2016-03-19 DIAGNOSIS — R05 Cough: Secondary | ICD-10-CM | POA: Diagnosis not present

## 2016-04-03 NOTE — Telephone Encounter (Signed)
Patients apt has been rescheduled

## 2016-04-16 ENCOUNTER — Ambulatory Visit: Payer: PPO | Admitting: Neurology

## 2016-05-26 ENCOUNTER — Telehealth: Payer: Self-pay | Admitting: Neurology

## 2016-05-26 NOTE — Telephone Encounter (Signed)
Cassandra Decker [phone] called request ship date for xeomin and address

## 2016-05-30 NOTE — Telephone Encounter (Signed)
Medication has arrived.

## 2016-06-09 DIAGNOSIS — Z6824 Body mass index (BMI) 24.0-24.9, adult: Secondary | ICD-10-CM | POA: Diagnosis not present

## 2016-06-09 DIAGNOSIS — M5440 Lumbago with sciatica, unspecified side: Secondary | ICD-10-CM | POA: Diagnosis not present

## 2016-06-09 DIAGNOSIS — I10 Essential (primary) hypertension: Secondary | ICD-10-CM | POA: Diagnosis not present

## 2016-06-09 DIAGNOSIS — M8588 Other specified disorders of bone density and structure, other site: Secondary | ICD-10-CM | POA: Diagnosis not present

## 2016-06-11 ENCOUNTER — Ambulatory Visit (INDEPENDENT_AMBULATORY_CARE_PROVIDER_SITE_OTHER): Payer: PPO | Admitting: Neurology

## 2016-06-11 ENCOUNTER — Encounter: Payer: Self-pay | Admitting: Neurology

## 2016-06-11 VITALS — BP 151/83 | HR 84 | Ht 64.0 in | Wt 135.0 lb

## 2016-06-11 DIAGNOSIS — G243 Spasmodic torticollis: Secondary | ICD-10-CM

## 2016-06-11 NOTE — Progress Notes (Signed)
PATIENT: Cassandra Decker DOB: 07/18/1945  Chief Complaint  Patient presents with  . Cervical Dystonia    Xeomin 300 units - specialty pharmacy     HISTORICAL  Cassandra Decker is a 71 years old right-handed female, alone at today's clinical visit, seen in refer by  Laroy Apple, her primary care Dr. Renelda Mom for evaluation of neck pain, abnormal neck posture  She had a history of hyperlipidemia, polymyalgia rheumatica, has been treated with prednisone 5 mg daily for few years, borderline diabetes, she works as Glass blower/designer for [employer]  Around 2010, she began to noticed neck discomfort, she tends to hold her neck to the right side, gradually getting worse, to the point of difficulty keeping her neck straight, tendency of head titubation, constant posterior neck pain, difficulty turning her neck to the left side, she also developed bilateral shoulder pain. She has difficulty driving, difficulty turning.  She has to hold her chin frequently to keep her neck in a straight position, she denies gait difficulty, no bowel and bladder incontinence.   She has gone through physical therapy, chiropractor, epidural injection without significant improvement   She had MRI of cervical spine at orthopedic clinic in August 2016, multilevel cervical degenerative changes, most severe at C5-6, with mild-to-moderate canal stenosis, no cord signal changes, was not able to reviewed the film  UPDATE March 28th 2017: We have personally reviewed MRI of cervical spine August 2016: Moderate multilevel cervical disc degeneration with moderate<BR>spinal stenosis at C5-6.  Mild spinal stenosis at C6-7 3. Mild-to-moderate multilevel foraminal stenosis as above.  Potential side effect of xeomin was explained to the patient, this is her first EMG guided xeomin injection  UPDATE June 29th 2017: She responded to her initial xeomin injection in March 28 very well, we used 200 units of xeomin, no significant  side effect, took about 2 weeks to take effect, the benefit lasts about 10 weeks, in the past 2 weeks, she noticed recurrent symptoms, neck pulling to her right side   UPDATE Oct 5th 2017: She received 200 units of xeomin had previous injection on September 27 2015, but she got flulike illness shortly afterwards, she did notice worsening right-sided neck pain, pulling of her neck at the end of 3 months.  UPDATE June 11 2016: She responded very well to previous 300 units xeomin injection in October 2017, denies significant side effect, but was not able to keep every 3 months injection due to high co-pay/medication cause.  REVIEW OF SYSTEMS: Full 14 system review of systems performed and notable only for as above   ALLERGIES: Allergies  Allergen Reactions  . Aspirin Swelling  . Sulfa Antibiotics Hives    HOME MEDICATIONS: Current Outpatient Prescriptions  Medication Sig Dispense Refill  . alendronate (FOSAMAX) 70 MG tablet Take 70 mg by mouth once a week. Take with a full glass of water on an empty stomach.    . Cholecalciferol (VITAMIN D3) 5000 units CAPS Take by mouth daily.    . cloNIDine (CATAPRES) 0.1 MG tablet Take 0.1 mg by mouth daily.     . Coenzyme Q10 (COQ10 PO) Take by mouth daily.    . COLOSTRUM PO Take by mouth daily.    . DULoxetine (CYMBALTA) 60 MG capsule Take 60 mg by mouth daily.    Marland Kitchen FIBER COMPLETE TABS Take by mouth. Chew 2-3 tablets daily.    Marland Kitchen HYDROcodone-acetaminophen (NORCO/VICODIN) 5-325 MG tablet Take 1 tablet by mouth as needed for moderate  pain.    . loratadine (CLARITIN) 10 MG tablet Take 10 mg by mouth as needed for allergies.    . meloxicam (MOBIC) 15 MG tablet Take 15 mg by mouth daily.    . metaxalone (SKELAXIN) 800 MG tablet Take 800 mg by mouth as needed for muscle spasms.    . methylPREDNISolone (MEDROL DOSEPAK) 4 MG TBPK tablet Take by mouth. Take as directed.    . Multiple Vitamins-Minerals (MULTIVITAMIN PO) Take by mouth. Take a multivitamin powder  twice daily (calcium 400mg , magnesium 200mg , vitamin C 500mg ).    . Multiple Vitamins-Minerals (MULTIVITAMIN WITH MINERALS) tablet Take 1 tablet by mouth daily.    Marland Kitchen omeprazole (PRILOSEC) 20 MG capsule Take 20 mg by mouth daily.    . rosuvastatin (CRESTOR) 10 MG tablet Take 10 mg by mouth daily.    . TURMERIC PO Take by mouth daily.     No current facility-administered medications for this visit.     PAST MEDICAL HISTORY: Past Medical History:  Diagnosis Date  . Acid reflux   . Cervical stenosis of spine    Past treament with ESI and nerve blocks.  . Diabetes ([place])    Borderline - A1C 6.5  . Diverticulitis   . Hyperlipemia   . Lumbar stenosis    Past treatment with ESIs.  . Muscle spasms of neck   . Neck pain     PAST SURGICAL HISTORY: Past Surgical History:  Procedure Laterality Date  . colon surgery     Diverticulitis  . OVARIAN CYST REMOVAL      FAMILY HISTORY: Family History  Problem Relation Age of Onset  . Stroke Mother   . Emphysema Father   . Cancer Paternal Grandfather     oral    SOCIAL HISTORY:  Social History   Social History  . Marital status: Single    Spouse name: N/A  . Number of children: 2  . Years of education: 14   Occupational History  . Glass blower/designer    Social History Main Topics  . Smoking status: Never Smoker  . Smokeless tobacco: Never Used  . Alcohol use No  . Drug use: No  . Sexual activity: Not on file   Other Topics Concern  . Not on file   Social History Narrative   Lives at home with husband.   Right-handed.   1-2 cup caffeine per day.     PHYSICAL EXAM   Vitals:   06/11/16 1435  BP: (!) 151/83  Pulse: 84  Weight: 135 lb (61.2 kg)  Height: 5\' 4"  (1.626 m)    Not recorded      Body mass index is 23.17 kg/m.  PHYSICAL EXAMNIATION:  Gen: NAD, conversant, well nourised, obese, well groomed                     Cardiovascular: Regular rate rhythm, no peripheral edema, warm, nontender. Eyes:  Conjunctivae clear without exudates or hemorrhage Neck: Supple, no carotid bruise. Pulmonary: Clear to auscultation bilaterally   NEUROLOGICAL EXAM:  MENTAL STATUS: Speech:    Speech is normal; fluent and spontaneous with normal comprehension.  Cognition:     Orientation to time, place and person     Normal recent and remote memory     Normal Attention span and concentration     Normal Language, naming, repeating,spontaneous speech     Fund of knowledge   CRANIAL NERVES: CN II: Visual fields are full to confrontation. Fundoscopic exam is normal  with sharp discs and no vascular changes. Pupils are round equal and briskly reactive to light. CN III, IV, VI: extraocular movement are normal. No ptosis. CN V: Facial sensation is intact to pinprick in all 3 divisions bilaterally. Corneal responses are intact.  CN VII: Face is symmetric with normal eye closure and smile. CN VIII: Hearing is normal to rubbing fingers CN IX, X: Palate elevates symmetrically. Phonation is normal. CN XI: Head turning and shoulder shrug are intact CN XII: Tongue is midline with normal movements and no atrophy.  MOTOR: Her chin was pulling to her right shoulder, with moderate anterocollis, right turn, moderate left tilt.  mild left shoulder elevation. Mild neck extension weakness  There is no pronator drift of out-stretched arms. Muscle bulk and tone are normal. Muscle strength is normal.  REFLEXES: Reflexes are 2+ and symmetric at the biceps, triceps, knees, and ankles. Plantar responses are flexor.  SENSORY: Intact to light touch, pinprick, position sense, and vibration sense are intact in fingers and toes.  COORDINATION: Rapid alternating movements and fine finger movements are intact. There is no dysmetria on finger-to-nose and heel-knee-shin.    GAIT/STANCE: Posture is normal. Gait is steady with normal steps, base, arm swing, and turning. Heel and toe walking are normal. Tandem gait is normal.    Romberg is absent.   DIAGNOSTIC DATA (LABS, IMAGING, TESTING) - I reviewed patient records, labs, notes, testing and imaging myself where available.   ASSESSMENT AND PLAN  Havanna Groner Uhlig is a 71 y.o. female    Cervical dystonia: She has occasionally head yes yes titubation, Her chin was pulling to her right shoulder, with moderate anterocollis, right turn, mild left shoulder elevation.  Her first EMG guided xeomin injection in March 28th 2017  Used Xeomin 300 units (100units/2cc NS) under EMG guidance  Left sternocleidomastoid 25 units x3=75 units   Right longissimus 25 x2= 50 units Right splenius capitis 25 units Right splenius cervix 25 units Right levator scapular 25 units Right semispinalis 25 units  Right illiocostalis 25 units Right upper trapezius 25 units  Right posterior scales 25 units   She tolerated the injection well,     Marcial Pacas, M.D. Ph.D.  [facility] [address] [place], [place] [address] Ph: [phone] Fax: [phone]   CC:Laroy Apple, MD. Dr. Nicholos Johns

## 2016-06-11 NOTE — Progress Notes (Signed)
**  Xeomin 100 units x 3 vials, NDC 7076-1518-34, Lot [id number], Exp 06/2018, specialty pharmacy.//mck,rn**

## 2016-06-20 DIAGNOSIS — E2839 Other primary ovarian failure: Secondary | ICD-10-CM | POA: Diagnosis not present

## 2016-06-20 DIAGNOSIS — Z1231 Encounter for screening mammogram for malignant neoplasm of breast: Secondary | ICD-10-CM | POA: Diagnosis not present

## 2016-06-20 DIAGNOSIS — M85851 Other specified disorders of bone density and structure, right thigh: Secondary | ICD-10-CM | POA: Diagnosis not present

## 2016-07-11 DIAGNOSIS — Z79899 Other long term (current) drug therapy: Secondary | ICD-10-CM | POA: Diagnosis not present

## 2016-07-11 DIAGNOSIS — E559 Vitamin D deficiency, unspecified: Secondary | ICD-10-CM | POA: Diagnosis not present

## 2016-07-11 DIAGNOSIS — E785 Hyperlipidemia, unspecified: Secondary | ICD-10-CM | POA: Diagnosis not present

## 2016-07-11 DIAGNOSIS — E119 Type 2 diabetes mellitus without complications: Secondary | ICD-10-CM | POA: Diagnosis not present

## 2016-07-16 DIAGNOSIS — M5440 Lumbago with sciatica, unspecified side: Secondary | ICD-10-CM | POA: Diagnosis not present

## 2016-07-16 DIAGNOSIS — Z1389 Encounter for screening for other disorder: Secondary | ICD-10-CM | POA: Diagnosis not present

## 2016-07-16 DIAGNOSIS — I10 Essential (primary) hypertension: Secondary | ICD-10-CM | POA: Diagnosis not present

## 2016-07-16 DIAGNOSIS — Z9181 History of falling: Secondary | ICD-10-CM | POA: Diagnosis not present

## 2016-07-16 DIAGNOSIS — Z6823 Body mass index (BMI) 23.0-23.9, adult: Secondary | ICD-10-CM | POA: Diagnosis not present

## 2016-07-16 DIAGNOSIS — M8580 Other specified disorders of bone density and structure, unspecified site: Secondary | ICD-10-CM | POA: Diagnosis not present

## 2016-09-24 DIAGNOSIS — Z23 Encounter for immunization: Secondary | ICD-10-CM | POA: Diagnosis not present

## 2016-09-24 DIAGNOSIS — E785 Hyperlipidemia, unspecified: Secondary | ICD-10-CM | POA: Diagnosis not present

## 2016-09-24 DIAGNOSIS — Z9181 History of falling: Secondary | ICD-10-CM | POA: Diagnosis not present

## 2016-09-24 DIAGNOSIS — Z1389 Encounter for screening for other disorder: Secondary | ICD-10-CM | POA: Diagnosis not present

## 2016-09-24 DIAGNOSIS — Z0000 Encounter for general adult medical examination without abnormal findings: Secondary | ICD-10-CM | POA: Diagnosis not present

## 2016-11-06 ENCOUNTER — Other Ambulatory Visit: Payer: Self-pay | Admitting: Neurology

## 2017-01-14 ENCOUNTER — Ambulatory Visit (INDEPENDENT_AMBULATORY_CARE_PROVIDER_SITE_OTHER): Payer: PPO | Admitting: Neurology

## 2017-01-14 ENCOUNTER — Encounter: Payer: Self-pay | Admitting: Neurology

## 2017-01-14 ENCOUNTER — Telehealth: Payer: Self-pay | Admitting: Neurology

## 2017-01-14 VITALS — BP 126/75 | HR 81 | Ht 64.0 in | Wt 138.5 lb

## 2017-01-14 DIAGNOSIS — G243 Spasmodic torticollis: Secondary | ICD-10-CM

## 2017-01-14 MED ORDER — INCOBOTULINUMTOXINA 100 UNITS IM SOLR
300.0000 [IU] | INTRAMUSCULAR | Status: DC
  Administered 2017-01-14: 300 [IU] via INTRAMUSCULAR

## 2017-01-14 MED ORDER — METAXALONE 800 MG PO TABS: 800.0000 mg | ORAL_TABLET | Freq: Every day | ORAL | 5 refills | Status: DC | PRN

## 2017-01-14 NOTE — Progress Notes (Signed)
**  Xeomin 100 units x 3 vials, NDC 5189-8421-03, Lot [id number], Exp 03/2018, specialty pharmacy.//mck,rn**

## 2017-01-14 NOTE — Telephone Encounter (Signed)
PT needs another injection in 6 months per Dr. Krista Blue

## 2017-01-14 NOTE — Progress Notes (Signed)
PATIENT: Cassandra Decker DOB: [birth year]  Chief Complaint  Patient presents with  . Cervical Dystonia    Xeomin 100 units x 3 vials - specialty pharmacy     HISTORICAL  Cassandra Decker is a 71 years old right-handed female, alone at today's clinical visit, seen in refer by  Laroy Apple, her primary care Dr. Renelda Mom for evaluation of neck pain, abnormal neck posture  She had a history of hyperlipidemia, polymyalgia rheumatica, has been treated with prednisone 5 mg daily for few years, borderline diabetes, she works as Glass blower/designer for [employer]  Around 2010, she began to noticed neck discomfort, she tends to hold her neck to the right side, gradually getting worse, to the point of difficulty keeping her neck straight, tendency of head titubation, constant posterior neck pain, difficulty turning her neck to the left side, she also developed bilateral shoulder pain. She has difficulty driving, difficulty turning.  She has to hold her chin frequently to keep her neck in a straight position, she denies gait difficulty, no bowel and bladder incontinence.   She has gone through physical therapy, chiropractor, epidural injection without significant improvement   She had MRI of cervical spine at orthopedic clinic in August 2016, multilevel cervical degenerative changes, most severe at C5-6, with mild-to-moderate canal stenosis, no cord signal changes, was not able to reviewed the film  UPDATE March 28th 2017: We have personally reviewed MRI of cervical spine August 2016: Moderate multilevel cervical disc degeneration with moderate<BR>spinal stenosis at C5-6.  Mild spinal stenosis at C6-7 3. Mild-to-moderate multilevel foraminal stenosis as above.  Potential side effect of xeomin was explained to the patient, this is her first EMG guided xeomin injection  UPDATE June 29th 2017: She responded to her initial xeomin injection in March 28 very well, we used 200 units of xeomin, no  significant side effect, took about 2 weeks to take effect, the benefit lasts about 10 weeks, in the past 2 weeks, she noticed recurrent symptoms, neck pulling to her right side   UPDATE Oct 5th 2017: She received 200 units of xeomin had previous injection on September 27 2015, but she got flulike illness shortly afterwards, she did notice worsening right-sided neck pain, pulling of her neck at the end of 3 months.  UPDATE June 11 2016: She responded very well to previous 300 units xeomin injection in October 2017, denies significant side effect, but was not able to keep every 3 months injection due to high co-pay/medication cause.  UPDATE Jan 14 2017: She responded very well to previous injection in March, does has transient swallowing difficulty, change of voice,  REVIEW OF SYSTEMS: Full 14 system review of systems performed and notable only for as above   ALLERGIES: Allergies  Allergen Reactions  . Aspirin Swelling  . Sulfa Antibiotics Hives    HOME MEDICATIONS: Current Outpatient Prescriptions  Medication Sig Dispense Refill  . alendronate (FOSAMAX) 70 MG tablet Take 70 mg by mouth once a week. Take with a full glass of water on an empty stomach.    . Cholecalciferol (VITAMIN D3) 5000 units CAPS Take by mouth daily.    . cloNIDine (CATAPRES) 0.1 MG tablet Take 0.1 mg by mouth daily.     . Coenzyme Q10 (COQ10 PO) Take by mouth daily.    . COLOSTRUM PO Take by mouth daily.    . DULoxetine (CYMBALTA) 60 MG capsule Take 60 mg by mouth daily.    Marland Kitchen FIBER COMPLETE TABS  Take by mouth. Chew 2-3 tablets daily.    Marland Kitchen HYDROcodone-acetaminophen (NORCO/VICODIN) 5-325 MG tablet Take 1 tablet by mouth as needed for moderate pain.    Marland Kitchen loratadine (CLARITIN) 10 MG tablet Take 10 mg by mouth as needed for allergies.    . meloxicam (MOBIC) 15 MG tablet Take 15 mg by mouth daily.    . metaxalone (SKELAXIN) 800 MG tablet Take 800 mg by mouth as needed for muscle spasms.    . methylPREDNISolone (MEDROL  DOSEPAK) 4 MG TBPK tablet Take by mouth. Take as directed.    . Multiple Vitamins-Minerals (MULTIVITAMIN PO) Take by mouth. Take a multivitamin powder twice daily (calcium 400mg , magnesium 200mg , vitamin C 500mg ).    . Multiple Vitamins-Minerals (MULTIVITAMIN WITH MINERALS) tablet Take 1 tablet by mouth daily.    Marland Kitchen omeprazole (PRILOSEC) 20 MG capsule Take 20 mg by mouth daily.    . rosuvastatin (CRESTOR) 10 MG tablet Take 10 mg by mouth daily.    . TURMERIC PO Take by mouth daily.    Marland Kitchen XEOMIN 100 units SOLR injection Inject  by md intramuscularly every 3 months for cervical dystonia. 3 each 3   No current facility-administered medications for this visit.     PAST MEDICAL HISTORY: Past Medical History:  Diagnosis Date  . Acid reflux   . Cervical stenosis of spine    Past treament with ESI and nerve blocks.  . Diabetes ([place])    Borderline - A1C 6.5  . Diverticulitis   . Hyperlipemia   . Lumbar stenosis    Past treatment with ESIs.  . Muscle spasms of neck   . Neck pain     PAST SURGICAL HISTORY: Past Surgical History:  Procedure Laterality Date  . colon surgery     Diverticulitis  . OVARIAN CYST REMOVAL      FAMILY HISTORY: Family History  Problem Relation Age of Onset  . Stroke Mother   . Emphysema Father   . Cancer Paternal Grandfather        oral    SOCIAL HISTORY:  Social History   Social History  . Marital status: Single    Spouse name: N/A  . Number of children: 2  . Years of education: 14   Occupational History  . Glass blower/designer    Social History Main Topics  . Smoking status: Never Smoker  . Smokeless tobacco: Never Used  . Alcohol use No  . Drug use: No  . Sexual activity: Not on file   Other Topics Concern  . Not on file   Social History Narrative   Lives at home with husband.   Right-handed.   1-2 cup caffeine per day.     PHYSICAL EXAM   Vitals:   01/14/17 1409  BP: 126/75  Pulse: 81  Weight: 138 lb 8 oz (62.8 kg)  Height: 5'  4" (1.626 m)    Not recorded      Body mass index is 23.77 kg/m.  PHYSICAL EXAMNIATION:  Gen: NAD, conversant, well nourised, obese, well groomed                     Cardiovascular: Regular rate rhythm, no peripheral edema, warm, nontender. Eyes: Conjunctivae clear without exudates or hemorrhage Neck: Supple, no carotid bruise. Pulmonary: Clear to auscultation bilaterally   NEUROLOGICAL EXAM:  MENTAL STATUS: Speech:    Speech is normal; fluent and spontaneous with normal comprehension.  Cognition:     Orientation to time, place and person  Normal recent and remote memory     Normal Attention span and concentration     Normal Language, naming, repeating,spontaneous speech     Fund of knowledge   CRANIAL NERVES: CN II: Visual fields are full to confrontation. Fundoscopic exam is normal with sharp discs and no vascular changes. Pupils are round equal and briskly reactive to light. CN III, IV, VI: extraocular movement are normal. No ptosis. CN V: Facial sensation is intact to pinprick in all 3 divisions bilaterally. Corneal responses are intact.  CN VII: Face is symmetric with normal eye closure and smile. CN VIII: Hearing is normal to rubbing fingers CN IX, X: Palate elevates symmetrically. Phonation is normal. CN XI: Head turning and shoulder shrug are intact CN XII: Tongue is midline with normal movements and no atrophy.  MOTOR: Her chin was pulling to her right shoulder, with moderate anterocollis, right turn, moderate left tilt.  mild left shoulder elevation. Mild neck extension weakness  There is no pronator drift of out-stretched arms. Muscle bulk and tone are normal. Muscle strength is normal.  REFLEXES: Reflexes are 2+ and symmetric at the biceps, triceps, knees, and ankles. Plantar responses are flexor.  SENSORY: Intact to light touch, pinprick, position sense, and vibration sense are intact in fingers and toes.  COORDINATION: Rapid alternating movements and  fine finger movements are intact. There is no dysmetria on finger-to-nose and heel-knee-shin.    GAIT/STANCE: Posture is normal. Gait is steady with normal steps, base, arm swing, and turning. Heel and toe walking are normal. Tandem gait is normal.  Romberg is absent.   DIAGNOSTIC DATA (LABS, IMAGING, TESTING) - I reviewed patient records, labs, notes, testing and imaging myself where available.   ASSESSMENT AND PLAN  Romayne Ticas Dewalt is a 71 y.o. female    Cervical dystonia: She has no head titubation, Her chin was pulling to her right shoulder, with moderate anterocollis, right turn, mild left tilt, mild left shoulder elevation.  Her first EMG guided xeomin injection in March 28th 2017  Used Xeomin 300 units (100units/2cc NS) under EMG guidance  Left sternocleidomastoid 25 units   Right longissimus 25 x2= 50 units Right splenius capitis 25 units Right splenius cervix 25 unitsx2= 50 units Right levator scapular 25 units Right semispinalis 25 units  Right illiocostalis 25 unitsx2=50 units  Right posterior scales 25x2=50 units   She tolerated the injection well,   Return in 6 months for repeat injections  Marcial Pacas, M.D. Ph.D.  [facility] [address] [place], [place] [address] Ph: [phone] Fax: [phone]   CC:Laroy Apple, MD. Dr. Nicholos Johns

## 2017-01-20 ENCOUNTER — Other Ambulatory Visit: Payer: Self-pay | Admitting: *Deleted

## 2017-01-20 NOTE — Telephone Encounter (Signed)
I called to schedule the patient, she did not answer so I left a VM asking her to call back to schedule.

## 2017-01-20 NOTE — Telephone Encounter (Signed)
Scheduled patient for Xeomin injection with Dr. Krista Blue on 07-15-17. She says she has every 6 months.

## 2017-01-21 DIAGNOSIS — E119 Type 2 diabetes mellitus without complications: Secondary | ICD-10-CM | POA: Diagnosis not present

## 2017-01-21 DIAGNOSIS — H25013 Cortical age-related cataract, bilateral: Secondary | ICD-10-CM | POA: Diagnosis not present

## 2017-01-22 NOTE — Telephone Encounter (Signed)
Noted, thank you

## 2017-01-26 ENCOUNTER — Telehealth: Payer: Self-pay | Admitting: Neurology

## 2017-01-26 MED ORDER — METAXALONE 800 MG PO TABS: 800.0000 mg | ORAL_TABLET | Freq: Three times a day (TID) | ORAL | 5 refills | Status: AC

## 2017-01-26 NOTE — Addendum Note (Signed)
Addended by: Noberto Retort C on: 01/26/2017 03:49 PM   Modules accepted: Orders

## 2017-01-26 NOTE — Telephone Encounter (Signed)
Left message requesting a return call to discuss her medication.

## 2017-01-26 NOTE — Telephone Encounter (Signed)
Pt calling re: her metaxalone (SKELAXIN) 800 MG tablet, she states that because of the way the prescription was written her insurance will not cover the medication.  She is asking for a call back about it

## 2017-01-26 NOTE — Telephone Encounter (Signed)
Spoke to patient - states she was previously getting a prescription for metaxalone 800mg , 1 tab TID PRN.  Ok per vo by Dr. Krista Blue to provide rx with these direction.  Pt states she usually just takes one tablet per day.

## 2017-01-29 DIAGNOSIS — M5440 Lumbago with sciatica, unspecified side: Secondary | ICD-10-CM | POA: Diagnosis not present

## 2017-01-29 DIAGNOSIS — Z6824 Body mass index (BMI) 24.0-24.9, adult: Secondary | ICD-10-CM | POA: Diagnosis not present

## 2017-01-29 DIAGNOSIS — R10813 Right lower quadrant abdominal tenderness: Secondary | ICD-10-CM | POA: Diagnosis not present

## 2017-01-29 DIAGNOSIS — R197 Diarrhea, unspecified: Secondary | ICD-10-CM | POA: Diagnosis not present

## 2017-03-11 DIAGNOSIS — M353 Polymyalgia rheumatica: Secondary | ICD-10-CM | POA: Diagnosis not present

## 2017-03-11 DIAGNOSIS — M5440 Lumbago with sciatica, unspecified side: Secondary | ICD-10-CM | POA: Diagnosis not present

## 2017-03-11 DIAGNOSIS — R7303 Prediabetes: Secondary | ICD-10-CM | POA: Diagnosis not present

## 2017-03-11 DIAGNOSIS — M1710 Unilateral primary osteoarthritis, unspecified knee: Secondary | ICD-10-CM | POA: Diagnosis not present

## 2017-03-11 DIAGNOSIS — E785 Hyperlipidemia, unspecified: Secondary | ICD-10-CM | POA: Diagnosis not present

## 2017-03-11 DIAGNOSIS — K529 Noninfective gastroenteritis and colitis, unspecified: Secondary | ICD-10-CM | POA: Diagnosis not present

## 2017-03-11 DIAGNOSIS — E559 Vitamin D deficiency, unspecified: Secondary | ICD-10-CM | POA: Diagnosis not present

## 2017-03-11 DIAGNOSIS — Z6824 Body mass index (BMI) 24.0-24.9, adult: Secondary | ICD-10-CM | POA: Diagnosis not present

## 2017-03-11 DIAGNOSIS — M1990 Unspecified osteoarthritis, unspecified site: Secondary | ICD-10-CM | POA: Diagnosis not present

## 2017-03-11 DIAGNOSIS — M4692 Unspecified inflammatory spondylopathy, cervical region: Secondary | ICD-10-CM | POA: Diagnosis not present

## 2017-03-11 DIAGNOSIS — Z79899 Other long term (current) drug therapy: Secondary | ICD-10-CM | POA: Diagnosis not present

## 2017-03-11 DIAGNOSIS — I10 Essential (primary) hypertension: Secondary | ICD-10-CM | POA: Diagnosis not present

## 2017-04-29 DIAGNOSIS — Z79899 Other long term (current) drug therapy: Secondary | ICD-10-CM | POA: Diagnosis not present

## 2017-04-29 DIAGNOSIS — E119 Type 2 diabetes mellitus without complications: Secondary | ICD-10-CM | POA: Diagnosis not present

## 2017-04-29 DIAGNOSIS — I10 Essential (primary) hypertension: Secondary | ICD-10-CM | POA: Diagnosis not present

## 2017-04-29 DIAGNOSIS — E785 Hyperlipidemia, unspecified: Secondary | ICD-10-CM | POA: Diagnosis not present

## 2017-04-29 DIAGNOSIS — J309 Allergic rhinitis, unspecified: Secondary | ICD-10-CM | POA: Diagnosis not present

## 2017-04-29 DIAGNOSIS — K219 Gastro-esophageal reflux disease without esophagitis: Secondary | ICD-10-CM | POA: Diagnosis not present

## 2017-04-29 DIAGNOSIS — M353 Polymyalgia rheumatica: Secondary | ICD-10-CM | POA: Diagnosis not present

## 2017-04-29 DIAGNOSIS — R7303 Prediabetes: Secondary | ICD-10-CM | POA: Diagnosis not present

## 2017-04-29 DIAGNOSIS — Z6825 Body mass index (BMI) 25.0-25.9, adult: Secondary | ICD-10-CM | POA: Diagnosis not present

## 2017-04-29 DIAGNOSIS — E559 Vitamin D deficiency, unspecified: Secondary | ICD-10-CM | POA: Diagnosis not present

## 2017-06-12 ENCOUNTER — Telehealth: Payer: Self-pay | Admitting: Neurology

## 2017-06-12 NOTE — Telephone Encounter (Signed)
Patient called in and r/s her appt from 07/15/17 to 09/09/17.

## 2017-06-15 NOTE — Telephone Encounter (Signed)
Noted, thank you

## 2017-07-15 ENCOUNTER — Ambulatory Visit: Payer: PPO | Admitting: Neurology

## 2017-07-27 DIAGNOSIS — J309 Allergic rhinitis, unspecified: Secondary | ICD-10-CM | POA: Diagnosis not present

## 2017-07-27 DIAGNOSIS — E663 Overweight: Secondary | ICD-10-CM | POA: Diagnosis not present

## 2017-07-27 DIAGNOSIS — N343 Urethral syndrome, unspecified: Secondary | ICD-10-CM | POA: Diagnosis not present

## 2017-07-27 DIAGNOSIS — M5440 Lumbago with sciatica, unspecified side: Secondary | ICD-10-CM | POA: Diagnosis not present

## 2017-07-27 DIAGNOSIS — E785 Hyperlipidemia, unspecified: Secondary | ICD-10-CM | POA: Diagnosis not present

## 2017-07-27 DIAGNOSIS — E119 Type 2 diabetes mellitus without complications: Secondary | ICD-10-CM | POA: Diagnosis not present

## 2017-07-27 DIAGNOSIS — M353 Polymyalgia rheumatica: Secondary | ICD-10-CM | POA: Diagnosis not present

## 2017-07-27 DIAGNOSIS — I10 Essential (primary) hypertension: Secondary | ICD-10-CM | POA: Diagnosis not present

## 2017-07-27 DIAGNOSIS — Z6836 Body mass index (BMI) 36.0-36.9, adult: Secondary | ICD-10-CM | POA: Diagnosis not present

## 2017-09-09 ENCOUNTER — Ambulatory Visit: Payer: PPO | Admitting: Neurology

## 2017-09-23 ENCOUNTER — Telehealth: Payer: Self-pay | Admitting: Neurology

## 2017-09-23 NOTE — Telephone Encounter (Signed)
I called to reschedule the patients Xeomin injection due to Dr. Krista Blue being out of office. She did not answer so I left a VM asking her to call back.

## 2017-09-23 NOTE — Telephone Encounter (Signed)
Pt has returned the call to Danielle, she is asking for a call back °

## 2017-09-24 NOTE — Telephone Encounter (Signed)
I returned the patients call but she did not answer so I left her a VM asking her to call me back. If she calls back and I am not in the office please make her aware we are having to move her 07/17 apt and I will call her back to r/s.

## 2017-09-24 NOTE — Telephone Encounter (Signed)
Pt returned Danielle's call. Per skype with Andee Poles pt scheduled appt for 8/7. She said they were planning a beach trip around that time. Could she come on sometime the last week of July??

## 2017-09-24 NOTE — Telephone Encounter (Signed)
Spoke to patient - she has been rescheduled for 10/28/17.

## 2017-09-28 NOTE — Telephone Encounter (Signed)
Noted, thank you

## 2017-10-06 DIAGNOSIS — Z1331 Encounter for screening for depression: Secondary | ICD-10-CM | POA: Diagnosis not present

## 2017-10-06 DIAGNOSIS — Z1231 Encounter for screening mammogram for malignant neoplasm of breast: Secondary | ICD-10-CM | POA: Diagnosis not present

## 2017-10-06 DIAGNOSIS — E785 Hyperlipidemia, unspecified: Secondary | ICD-10-CM | POA: Diagnosis not present

## 2017-10-06 DIAGNOSIS — Z139 Encounter for screening, unspecified: Secondary | ICD-10-CM | POA: Diagnosis not present

## 2017-10-06 DIAGNOSIS — Z1339 Encounter for screening examination for other mental health and behavioral disorders: Secondary | ICD-10-CM | POA: Diagnosis not present

## 2017-10-06 DIAGNOSIS — Z0000 Encounter for general adult medical examination without abnormal findings: Secondary | ICD-10-CM | POA: Diagnosis not present

## 2017-10-06 DIAGNOSIS — Z9181 History of falling: Secondary | ICD-10-CM | POA: Diagnosis not present

## 2017-10-15 ENCOUNTER — Ambulatory Visit: Payer: PPO | Admitting: Neurology

## 2017-10-28 ENCOUNTER — Telehealth: Payer: Self-pay | Admitting: Neurology

## 2017-10-28 ENCOUNTER — Ambulatory Visit: Payer: PPO | Admitting: Neurology

## 2017-10-28 ENCOUNTER — Encounter: Payer: Self-pay | Admitting: Neurology

## 2017-10-28 VITALS — BP 143/72 | HR 71 | Ht 64.0 in | Wt 154.0 lb

## 2017-10-28 DIAGNOSIS — G243 Spasmodic torticollis: Secondary | ICD-10-CM

## 2017-10-28 NOTE — Progress Notes (Signed)
PATIENT: Cassandra Decker DOB: 1946-03-16  Chief Complaint  Patient presents with  . Cervical Dystonia    Xeomin 100 units x 3 vials - specialty pharmacy     HISTORICAL  Cassandra Decker is a 72 years old right-handed female, alone at today's clinical visit, seen in refer by  Laroy Apple, her primary care Dr. Renelda Mom for evaluation of neck pain, abnormal neck posture  She had a history of hyperlipidemia, polymyalgia rheumatica, has been treated with prednisone 5 mg daily for few years, borderline diabetes, she works as Glass blower/designer for [employer]  Around 2010, she began to noticed neck discomfort, she tends to hold her neck to the right side, gradually getting worse, to the point of difficulty keeping her neck straight, tendency of head titubation, constant posterior neck pain, difficulty turning her neck to the left side, she also developed bilateral shoulder pain. She has difficulty driving, difficulty turning.  She has to hold her chin frequently to keep her neck in a straight position, she denies gait difficulty, no bowel and bladder incontinence.   She has gone through physical therapy, chiropractor, epidural injection without significant improvement   She had MRI of cervical spine at orthopedic clinic in August 2016, multilevel cervical degenerative changes, most severe at C5-6, with mild-to-moderate canal stenosis, no cord signal changes, was not able to reviewed the film  UPDATE March 28th 2017: We have personally reviewed MRI of cervical spine August 2016: Moderate multilevel cervical disc degeneration with moderate<BR>spinal stenosis at C5-6.  Mild spinal stenosis at C6-7 3. Mild-to-moderate multilevel foraminal stenosis as above.  Potential side effect of xeomin was explained to the patient, this is her first EMG guided xeomin injection  UPDATE June 29th 2017: She responded to her initial xeomin injection in March 28 very well, we used 200 units of xeomin, no  significant side effect, took about 2 weeks to take effect, the benefit lasts about 10 weeks, in the past 2 weeks, she noticed recurrent symptoms, neck pulling to her right side   UPDATE Oct 5th 2017: She received 200 units of xeomin had previous injection on September 27 2015, but she got flulike illness shortly afterwards, she did notice worsening right-sided neck pain, pulling of her neck at the end of 3 months.  UPDATE June 11 2016: She responded very well to previous 300 units xeomin injection in October 2017, denies significant side effect, but was not able to keep every 3 months injection due to high co-pay/medication cause.  UPDATE Jan 14 2017: She responded very well to previous injection in March, does has transient swallowing difficulty, change of voice,  UPDATE October 28 2017: She responded very well to previous injection, we only used xeomin 200 units  REVIEW OF SYSTEMS: Full 14 system review of systems performed and notable only for as above   ALLERGIES: Allergies  Allergen Reactions  . Aspirin Swelling  . Sulfa Antibiotics Hives    HOME MEDICATIONS: Current Outpatient Medications  Medication Sig Dispense Refill  . alendronate (FOSAMAX) 70 MG tablet Take 70 mg by mouth once a week. Take with a full glass of water on an empty stomach.    . Cholecalciferol (VITAMIN D3) 5000 units CAPS Take by mouth daily.    . cloNIDine (CATAPRES) 0.1 MG tablet Take 0.1 mg by mouth daily.     . Coenzyme Q10 (COQ10 PO) Take by mouth daily.    . COLOSTRUM PO Take by mouth daily.    Marland Kitchen  DULoxetine (CYMBALTA) 60 MG capsule Take 60 mg by mouth daily.    Marland Kitchen FIBER COMPLETE TABS Take by mouth. Chew 2-3 tablets daily.    Marland Kitchen loratadine (CLARITIN) 10 MG tablet Take 10 mg by mouth as needed for allergies.    . meloxicam (MOBIC) 15 MG tablet Take 15 mg by mouth daily.    . metaxalone (SKELAXIN) 800 MG tablet Take 1 tablet (800 mg total) by mouth 3 (three) times daily. 90 tablet 5  . metFORMIN (GLUCOPHAGE)  500 MG tablet Take 250 mg by mouth 2 (two) times daily.  2  . methylPREDNISolone (MEDROL DOSEPAK) 4 MG TBPK tablet Take by mouth. Take as directed.    . Multiple Vitamins-Minerals (MULTIVITAMIN PO) Take by mouth. Take a multivitamin powder twice daily (calcium 400mg , magnesium 200mg , vitamin C 500mg ).    . Multiple Vitamins-Minerals (MULTIVITAMIN WITH MINERALS) tablet Take 1 tablet by mouth daily.    Marland Kitchen omeprazole (PRILOSEC) 20 MG capsule Take 20 mg by mouth daily.    . rosuvastatin (CRESTOR) 10 MG tablet Take 10 mg by mouth daily.    . TURMERIC PO Take by mouth daily.    Marland Kitchen XEOMIN 100 units SOLR injection Inject  by md intramuscularly every 3 months for cervical dystonia. 3 each 3   Current Facility-Administered Medications  Medication Dose Route Frequency Provider Last Rate Last Dose  . incobotulinumtoxinA (XEOMIN) 100 units injection 300 Units  300 Units Intramuscular Q90 days Marcial Pacas, MD   300 Units at 01/14/17 1627    PAST MEDICAL HISTORY: Past Medical History:  Diagnosis Date  . Acid reflux   . Cervical stenosis of spine    Past treament with ESI and nerve blocks.  . Diabetes ([place])    Borderline - A1C 6.5  . Diverticulitis   . Hyperlipemia   . Lumbar stenosis    Past treatment with ESIs.  . Muscle spasms of neck   . Neck pain     PAST SURGICAL HISTORY: The histories are not reviewed yet. Please review them in the "History" navigator section and refresh this [place].  FAMILY HISTORY: Family History  Problem Relation Age of Onset  . Stroke Mother   . Emphysema Father   . Cancer Paternal Grandfather        oral    SOCIAL HISTORY:  Social History   Socioeconomic History  . Marital status: Single    Spouse name: Not on file  . Number of children: 2  . Years of education: 33  . Highest education level: Not on file  Occupational History  . Occupation: Glass blower/designer  Social Needs  . Financial resource strain: Not on file  . Food insecurity:    Worry: Not on  file    Inability: Not on file  . Transportation needs:    Medical: Not on file    Non-medical: Not on file  Tobacco Use  . Smoking status: Never Smoker  . Smokeless tobacco: Never Used  Substance and Sexual Activity  . Alcohol use: No    Alcohol/week: 0.0 oz  . Drug use: No  . Sexual activity: Not on file  Lifestyle  . Physical activity:    Days per week: Not on file    Minutes per session: Not on file  . Stress: Not on file  Relationships  . Social connections:    Talks on phone: Not on file    Gets together: Not on file    Attends religious service: Not on file  Active member of club or organization: Not on file    Attends meetings of clubs or organizations: Not on file    Relationship status: Not on file  . Intimate partner violence:    Fear of current or ex partner: Not on file    Emotionally abused: Not on file    Physically abused: Not on file    Forced sexual activity: Not on file  Other Topics Concern  . Not on file  Social History Narrative   Lives at home with husband.   Right-handed.   1-2 cup caffeine per day.     PHYSICAL EXAM   Vitals:   10/28/17 1305  BP: (!) 143/72  Pulse: 71  Weight: 154 lb (69.9 kg)  Height: 5\' 4"  (1.626 m)    Not recorded      Body mass index is 26.43 kg/m.  PHYSICAL EXAMNIATION:  Gen: NAD, conversant, well nourised, obese, well groomed                     Cardiovascular: Regular rate rhythm, no peripheral edema, warm, nontender. Eyes: Conjunctivae clear without exudates or hemorrhage Neck: Supple, no carotid bruise. Pulmonary: Clear to auscultation bilaterally   NEUROLOGICAL EXAM:  MENTAL STATUS: Speech:    Speech is normal; fluent and spontaneous with normal comprehension.  Cognition:     Orientation to time, place and person     Normal recent and remote memory     Normal Attention span and concentration     Normal Language, naming, repeating,spontaneous speech     Fund of knowledge   CRANIAL  NERVES: CN II: Visual fields are full to confrontation. Fundoscopic exam is normal with sharp discs and no vascular changes. Pupils are round equal and briskly reactive to light. CN III, IV, VI: extraocular movement are normal. No ptosis. CN V: Facial sensation is intact to pinprick in all 3 divisions bilaterally. Corneal responses are intact.  CN VII: Face is symmetric with normal eye closure and smile. CN VIII: Hearing is normal to rubbing fingers CN IX, X: Palate elevates symmetrically. Phonation is normal. CN XI: Head turning and shoulder shrug are intact CN XII: Tongue is midline with normal movements and no atrophy.  MOTOR: Her chin was pulling to her right shoulder, with moderate anterocollis, right turn, moderate left tilt.  mild left shoulder elevation. Mild neck extension weakness  There is no pronator drift of out-stretched arms. Muscle bulk and tone are normal. Muscle strength is normal.  REFLEXES: Reflexes are 2+ and symmetric at the biceps, triceps, knees, and ankles. Plantar responses are flexor.  SENSORY: Intact to light touch, pinprick, position sense, and vibration sense are intact in fingers and toes.  COORDINATION: Rapid alternating movements and fine finger movements are intact. There is no dysmetria on finger-to-nose and heel-knee-shin.    GAIT/STANCE: Posture is normal. Gait is steady with normal steps, base, arm swing, and turning. Heel and toe walking are normal. Tandem gait is normal.  Romberg is absent.   DIAGNOSTIC DATA (LABS, IMAGING, TESTING) - I reviewed patient records, labs, notes, testing and imaging myself where available.   ASSESSMENT AND PLAN  Jaryah L Sparano is a 71 y.o. female    Cervical dystonia:  She has mild to moderate anterocollis, mild left tilt, mild left shoulder elevation.   Her first EMG guided xeomin injection in March 28th 2017  Used Xeomin 200 units (100units/2cc NS) under EMG guidance   Right longissimus 25   units Right splenius  capitis 25 units Right splenius cervix 25 units  Left levator scapular 25 units Right semispinalis 25 units  Right inferior oblique capitis 25 units Left inferior oblique capitis 25 units   Left Iliocostalis 25 units     She tolerated the injection well,   Return in 6 months for repeat injections  Marcial Pacas, M.D. Ph.D.  [facility] [address], [place], [place] [address] Ph: [phone] Fax: [phone]   CC:Laroy Apple, MD. Dr. Nicholos Johns

## 2017-10-28 NOTE — Telephone Encounter (Signed)
She has 300 units of xeomin from specialty pharmacy, will only use 200 units of xeomin on October 28, 2017,   Please decrease the injection dose to xeomin 200 units for next injection,

## 2017-10-28 NOTE — Telephone Encounter (Signed)
noted 

## 2017-10-28 NOTE — Progress Notes (Signed)
**  Xeomin 100 units x 3 vials, NDC 8685-4883-01, Lot [id number], Exp 10/2019, specialty pharmacy.//mck,rn**

## 2017-10-28 NOTE — Telephone Encounter (Signed)
Noted, thank you

## 2017-10-29 DIAGNOSIS — E663 Overweight: Secondary | ICD-10-CM | POA: Diagnosis not present

## 2017-10-29 DIAGNOSIS — E119 Type 2 diabetes mellitus without complications: Secondary | ICD-10-CM | POA: Diagnosis not present

## 2017-10-29 DIAGNOSIS — Z6826 Body mass index (BMI) 26.0-26.9, adult: Secondary | ICD-10-CM | POA: Diagnosis not present

## 2017-10-29 DIAGNOSIS — N951 Menopausal and female climacteric states: Secondary | ICD-10-CM | POA: Diagnosis not present

## 2017-10-29 DIAGNOSIS — I10 Essential (primary) hypertension: Secondary | ICD-10-CM | POA: Diagnosis not present

## 2017-10-29 DIAGNOSIS — E559 Vitamin D deficiency, unspecified: Secondary | ICD-10-CM | POA: Diagnosis not present

## 2017-10-29 DIAGNOSIS — Z79899 Other long term (current) drug therapy: Secondary | ICD-10-CM | POA: Diagnosis not present

## 2017-10-29 DIAGNOSIS — Z1331 Encounter for screening for depression: Secondary | ICD-10-CM | POA: Diagnosis not present

## 2017-10-29 DIAGNOSIS — E785 Hyperlipidemia, unspecified: Secondary | ICD-10-CM | POA: Diagnosis not present

## 2017-10-29 DIAGNOSIS — M353 Polymyalgia rheumatica: Secondary | ICD-10-CM | POA: Diagnosis not present

## 2017-10-29 DIAGNOSIS — M19011 Primary osteoarthritis, right shoulder: Secondary | ICD-10-CM | POA: Diagnosis not present

## 2017-11-04 ENCOUNTER — Ambulatory Visit: Payer: PPO | Admitting: Neurology

## 2017-11-12 DIAGNOSIS — H1131 Conjunctival hemorrhage, right eye: Secondary | ICD-10-CM | POA: Diagnosis not present

## 2017-11-16 ENCOUNTER — Telehealth: Payer: Self-pay | Admitting: Neurology

## 2017-11-16 MED ORDER — INCOBOTULINUMTOXINA 100 UNITS IM SOLR: 200.0000 [IU] | INTRAMUSCULAR | 3 refills | Status: DC

## 2017-11-16 NOTE — Telephone Encounter (Signed)
Patient is getting Xeomin 200 units every three months.  New rx sent to [facility].

## 2017-11-16 NOTE — Telephone Encounter (Signed)
[facility] sent refill request on 8/5 and /812 to [phone]. Please call or send refill.

## 2018-01-21 DIAGNOSIS — Z6827 Body mass index (BMI) 27.0-27.9, adult: Secondary | ICD-10-CM | POA: Diagnosis not present

## 2018-01-21 DIAGNOSIS — R350 Frequency of micturition: Secondary | ICD-10-CM | POA: Diagnosis not present

## 2018-01-21 DIAGNOSIS — R05 Cough: Secondary | ICD-10-CM | POA: Diagnosis not present

## 2018-01-27 DIAGNOSIS — E559 Vitamin D deficiency, unspecified: Secondary | ICD-10-CM | POA: Diagnosis not present

## 2018-01-27 DIAGNOSIS — Z79899 Other long term (current) drug therapy: Secondary | ICD-10-CM | POA: Diagnosis not present

## 2018-01-27 DIAGNOSIS — E119 Type 2 diabetes mellitus without complications: Secondary | ICD-10-CM | POA: Diagnosis not present

## 2018-01-28 ENCOUNTER — Telehealth: Payer: Self-pay | Admitting: Neurology

## 2018-01-28 NOTE — Telephone Encounter (Signed)
I called the patient back and discussed her medication refill she stated that she should still have 200 units here from previous visit. DW

## 2018-01-28 NOTE — Telephone Encounter (Signed)
Pt is asking for a call from Danielle re: the ordering of her Botox °

## 2018-02-01 DIAGNOSIS — I10 Essential (primary) hypertension: Secondary | ICD-10-CM | POA: Diagnosis not present

## 2018-02-01 DIAGNOSIS — E785 Hyperlipidemia, unspecified: Secondary | ICD-10-CM | POA: Diagnosis not present

## 2018-02-01 DIAGNOSIS — M353 Polymyalgia rheumatica: Secondary | ICD-10-CM | POA: Diagnosis not present

## 2018-02-01 DIAGNOSIS — Z6827 Body mass index (BMI) 27.0-27.9, adult: Secondary | ICD-10-CM | POA: Diagnosis not present

## 2018-02-01 DIAGNOSIS — E119 Type 2 diabetes mellitus without complications: Secondary | ICD-10-CM | POA: Diagnosis not present

## 2018-02-01 DIAGNOSIS — R7989 Other specified abnormal findings of blood chemistry: Secondary | ICD-10-CM | POA: Diagnosis not present

## 2018-02-01 DIAGNOSIS — E559 Vitamin D deficiency, unspecified: Secondary | ICD-10-CM | POA: Diagnosis not present

## 2018-02-01 DIAGNOSIS — Z79899 Other long term (current) drug therapy: Secondary | ICD-10-CM | POA: Diagnosis not present

## 2018-02-01 DIAGNOSIS — M79671 Pain in right foot: Secondary | ICD-10-CM | POA: Diagnosis not present

## 2018-02-01 NOTE — Telephone Encounter (Signed)
I called the patient back but she did not answer, I left a VM asking her to call me back.

## 2018-02-02 NOTE — Telephone Encounter (Signed)
Pt called stating the pharmacy will ship medication but it will not be here before upcoming appt tomorrow 11/6

## 2018-02-02 NOTE — Telephone Encounter (Signed)
I spoke with the patient who is going to call and give consent to the pharmacy.

## 2018-02-03 ENCOUNTER — Ambulatory Visit: Payer: PPO | Admitting: Neurology

## 2018-02-05 NOTE — Telephone Encounter (Signed)
[facility] called and the Xeomin will TBD on Wednesday 02/10/18.

## 2018-02-10 ENCOUNTER — Ambulatory Visit (INDEPENDENT_AMBULATORY_CARE_PROVIDER_SITE_OTHER): Payer: PPO | Admitting: Neurology

## 2018-02-10 ENCOUNTER — Encounter: Payer: Self-pay | Admitting: Neurology

## 2018-02-10 ENCOUNTER — Telehealth: Payer: Self-pay | Admitting: Neurology

## 2018-02-10 VITALS — BP 148/82 | HR 76 | Ht 64.0 in | Wt 149.0 lb

## 2018-02-10 DIAGNOSIS — G243 Spasmodic torticollis: Secondary | ICD-10-CM | POA: Diagnosis not present

## 2018-02-10 NOTE — Telephone Encounter (Signed)
Noted, this has been documented. Medication is here. DW

## 2018-02-10 NOTE — Telephone Encounter (Signed)
Cassandra Decker, she should have extra xeomin 100 units in our office through her speciality pharmacy, we will only use xeomin 100 units for next injection on May 12 2018.

## 2018-02-10 NOTE — Progress Notes (Signed)
**  Xeomin 100 units x 2 vials, NDC 8948-3475-83, Lot [id number], Exp 03/2020, specialty pharmacy.//mck.,rn**

## 2018-02-10 NOTE — Progress Notes (Signed)
PATIENT: Cassandra Decker DOB: [birth year]  Chief Complaint  Patient presents with  . Cervical Dystonia    Xeomin 100 units x 2 vials - specialty pharmacy     HISTORICAL  Cassandra Decker is a 72 years old right-handed female, alone at today's clinical visit, seen in refer by  Laroy Apple, her primary care Dr. Renelda Mom for evaluation of neck pain, abnormal neck posture  She had a history of hyperlipidemia, polymyalgia rheumatica, has been treated with prednisone 5 mg daily for few years, borderline diabetes, she works as Glass blower/designer for [employer]  Around 2010, she began to noticed neck discomfort, she tends to hold her neck to the right side, gradually getting worse, to the point of difficulty keeping her neck straight, tendency of head titubation, constant posterior neck pain, difficulty turning her neck to the left side, she also developed bilateral shoulder pain. She has difficulty driving, difficulty turning.  She has to hold her chin frequently to keep her neck in a straight position, she denies gait difficulty, no bowel and bladder incontinence.   She has gone through physical therapy, chiropractor, epidural injection without significant improvement   She had MRI of cervical spine at orthopedic clinic in August 2016, multilevel cervical degenerative changes, most severe at C5-6, with mild-to-moderate canal stenosis, no cord signal changes, was not able to reviewed the film  UPDATE March 28th 2017: We have personally reviewed MRI of cervical spine August 2016: Moderate multilevel cervical disc degeneration with moderate<BR>spinal stenosis at C5-6.  Mild spinal stenosis at C6-7 3. Mild-to-moderate multilevel foraminal stenosis as above.  Potential side effect of xeomin was explained to the patient, this is her first EMG guided xeomin injection  UPDATE June 29th 2017: She responded to her initial xeomin injection in March 28 very well, we used 200 units of xeomin, no  significant side effect, took about 2 weeks to take effect, the benefit lasts about 10 weeks, in the past 2 weeks, she noticed recurrent symptoms, neck pulling to her right side   UPDATE Oct 5th 2017: She received 200 units of xeomin had previous injection on September 27 2015, but she got flulike illness shortly afterwards, she did notice worsening right-sided neck pain, pulling of her neck at the end of 3 months.  UPDATE June 11 2016: She responded very well to previous 300 units xeomin injection in October 2017, denies significant side effect, but was not able to keep every 3 months injection due to high co-pay/medication cause.  UPDATE Jan 14 2017: She responded very well to previous injection in March, does has transient swallowing difficulty, change of voice,  UPDATE October 28 2017: She responded very well to previous injection, we only used xeomin 200 units  UPDATE Feb 10 2018: She responded very well to previous Xeomin injection, there was no significant side effect noticed.   REVIEW OF SYSTEMS: Full 14 system review of systems performed and notable only for as above   ALLERGIES: Allergies  Allergen Reactions  . Aspirin Swelling  . Sulfa Antibiotics Hives    HOME MEDICATIONS: Current Outpatient Medications  Medication Sig Dispense Refill  . alendronate (FOSAMAX) 70 MG tablet Take 70 mg by mouth once a week. Take with a full glass of water on an empty stomach.    . Cholecalciferol (VITAMIN D3) 5000 units CAPS Take by mouth daily.    . cloNIDine (CATAPRES) 0.1 MG tablet Take 0.1 mg by mouth daily.     . Coenzyme  Q10 (COQ10 PO) Take by mouth daily.    . COLOSTRUM PO Take by mouth daily.    . DULoxetine (CYMBALTA) 60 MG capsule Take 60 mg by mouth daily.    Marland Kitchen FIBER COMPLETE TABS Take by mouth. Chew 2-3 tablets daily.    Marland Kitchen incobotulinumtoxinA (XEOMIN) 100 units SOLR injection Inject 200 Units into the muscle every 3 (three) months. 2 each 3  . loratadine (CLARITIN) 10 MG tablet  Take 10 mg by mouth as needed for allergies.    . meloxicam (MOBIC) 15 MG tablet Take 15 mg by mouth as needed.     . metaxalone (SKELAXIN) 800 MG tablet Take 1 tablet (800 mg total) by mouth 3 (three) times daily. (Patient taking differently: Take 800 mg by mouth 3 (three) times daily as needed. ) 90 tablet 5  . metFORMIN (GLUCOPHAGE) 500 MG tablet Take 250 mg by mouth 2 (two) times daily.  2  . methylPREDNISolone (MEDROL DOSEPAK) 4 MG TBPK tablet Take by mouth. Take as directed.    . Multiple Vitamins-Minerals (MULTIVITAMIN PO) Take by mouth. Take a multivitamin powder twice daily (calcium 400mg , magnesium 200mg , vitamin C 500mg ).    . Multiple Vitamins-Minerals (MULTIVITAMIN WITH MINERALS) tablet Take 1 tablet by mouth daily.    Marland Kitchen omeprazole (PRILOSEC) 20 MG capsule Take 20 mg by mouth daily.    . rosuvastatin (CRESTOR) 10 MG tablet Take 10 mg by mouth daily.    . TURMERIC PO Take by mouth daily.     No current facility-administered medications for this visit.     PAST MEDICAL HISTORY: Past Medical History:  Diagnosis Date  . Acid reflux   . Cervical stenosis of spine    Past treament with ESI and nerve blocks.  . Diabetes ([place])    Borderline - A1C 6.5  . Diverticulitis   . Hyperlipemia   . Lumbar stenosis    Past treatment with ESIs.  . Muscle spasms of neck   . Neck pain     PAST SURGICAL HISTORY: The histories are not reviewed yet. Please review them in the "History" navigator section and refresh this [place].  FAMILY HISTORY: Family History  Problem Relation Age of Onset  . Stroke Mother   . Emphysema Father   . Cancer Paternal Grandfather        oral    SOCIAL HISTORY:  Social History   Socioeconomic History  . Marital status: Single    Spouse name: Not on file  . Number of children: 2  . Years of education: 61  . Highest education level: Not on file  Occupational History  . Occupation: Glass blower/designer  Social Needs  . Financial resource strain: Not on  file  . Food insecurity:    Worry: Not on file    Inability: Not on file  . Transportation needs:    Medical: Not on file    Non-medical: Not on file  Tobacco Use  . Smoking status: Never Smoker  . Smokeless tobacco: Never Used  Substance and Sexual Activity  . Alcohol use: No    Alcohol/week: 0.0 standard drinks  . Drug use: No  . Sexual activity: Not on file  Lifestyle  . Physical activity:    Days per week: Not on file    Minutes per session: Not on file  . Stress: Not on file  Relationships  . Social connections:    Talks on phone: Not on file    Gets together: Not on file  Attends religious service: Not on file    Active member of club or organization: Not on file    Attends meetings of clubs or organizations: Not on file    Relationship status: Not on file  . Intimate partner violence:    Fear of current or ex partner: Not on file    Emotionally abused: Not on file    Physically abused: Not on file    Forced sexual activity: Not on file  Other Topics Concern  . Not on file  Social History Narrative   Lives at home with husband.   Right-handed.   1-2 cup caffeine per day.     PHYSICAL EXAM   Vitals:   02/10/18 1059  Weight: 149 lb (67.6 kg)  Height: 5\' 4"  (1.626 m)    Not recorded      Body mass index is 25.58 kg/m.  PHYSICAL EXAMNIATION:  Gen: NAD, conversant, well nourised, obese, well groomed                     Cardiovascular: Regular rate rhythm, no peripheral edema, warm, nontender. Eyes: Conjunctivae clear without exudates or hemorrhage Neck: Supple, no carotid bruise. Pulmonary: Clear to auscultation bilaterally   NEUROLOGICAL EXAM:  MENTAL STATUS: Speech:    Speech is normal; fluent and spontaneous with normal comprehension.  Cognition:     Orientation to time, place and person     Normal recent and remote memory     Normal Attention span and concentration     Normal Language, naming, repeating,spontaneous speech     Fund of  knowledge   CRANIAL NERVES: CN II: Visual fields are full to confrontation. Fundoscopic exam is normal with sharp discs and no vascular changes. Pupils are round equal and briskly reactive to light. CN III, IV, VI: extraocular movement are normal. No ptosis. CN V: Facial sensation is intact to pinprick in all 3 divisions bilaterally. Corneal responses are intact.  CN VII: Face is symmetric with normal eye closure and smile. CN VIII: Hearing is normal to rubbing fingers CN IX, X: Palate elevates symmetrically. Phonation is normal. CN XI: Head turning and shoulder shrug are intact CN XII: Tongue is midline with normal movements and no atrophy.  MOTOR: Her chin was pulling to her right shoulder, with moderate anterocollis, right turn, moderate left tilt.  mild left shoulder elevation. Mild neck extension weakness  There is no pronator drift of out-stretched arms. Muscle bulk and tone are normal. Muscle strength is normal.  REFLEXES: Reflexes are 2+ and symmetric at the biceps, triceps, knees, and ankles. Plantar responses are flexor.  SENSORY: Intact to light touch, pinprick, position sense, and vibration sense are intact in fingers and toes.  COORDINATION: Rapid alternating movements and fine finger movements are intact. There is no dysmetria on finger-to-nose and heel-knee-shin.    GAIT/STANCE: Posture is normal. Gait is steady with normal steps, base, arm swing, and turning. Heel and toe walking are normal. Tandem gait is normal.  Romberg is absent.   DIAGNOSTIC DATA (LABS, IMAGING, TESTING) - I reviewed patient records, labs, notes, testing and imaging myself where available.   ASSESSMENT AND PLAN  Cassandra Decker is a 72 y.o. female    Cervical dystonia:  She has mild to moderate anterocollis, mild left tilt, mild left shoulder elevation.   Her first EMG guided xeomin injection in March 28th 2017  Used Xeomin 200 units (100units/2cc NS) under EMG guidance   Right  sternocleidomastoid 50 units Left  sternocleidomastoid 50 units   Left levator scapular 25 units Right levator scapular 12.5 units Right upper trapezius 12.5 units Left longissimus capitis 25 units Right longissimus capitis 25 units   She tolerated the injection well,   Return in 6 months for repeat injections  Marcial Pacas, M.D. Ph.D.  [facility] [address] [place], [place] [address] Ph: [phone] Fax: [phone]   CC:Laroy Apple, MD. Dr. Nicholos Johns

## 2018-03-17 DIAGNOSIS — M5440 Lumbago with sciatica, unspecified side: Secondary | ICD-10-CM | POA: Diagnosis not present

## 2018-03-17 DIAGNOSIS — Z6827 Body mass index (BMI) 27.0-27.9, adult: Secondary | ICD-10-CM | POA: Diagnosis not present

## 2018-03-17 DIAGNOSIS — E663 Overweight: Secondary | ICD-10-CM | POA: Diagnosis not present

## 2018-03-17 DIAGNOSIS — Z23 Encounter for immunization: Secondary | ICD-10-CM | POA: Diagnosis not present

## 2018-04-06 DIAGNOSIS — E119 Type 2 diabetes mellitus without complications: Secondary | ICD-10-CM | POA: Diagnosis not present

## 2018-04-12 DIAGNOSIS — Z6827 Body mass index (BMI) 27.0-27.9, adult: Secondary | ICD-10-CM | POA: Diagnosis not present

## 2018-04-12 DIAGNOSIS — R7989 Other specified abnormal findings of blood chemistry: Secondary | ICD-10-CM | POA: Diagnosis not present

## 2018-04-12 DIAGNOSIS — E785 Hyperlipidemia, unspecified: Secondary | ICD-10-CM | POA: Diagnosis not present

## 2018-04-12 DIAGNOSIS — M353 Polymyalgia rheumatica: Secondary | ICD-10-CM | POA: Diagnosis not present

## 2018-04-12 DIAGNOSIS — M5440 Lumbago with sciatica, unspecified side: Secondary | ICD-10-CM | POA: Diagnosis not present

## 2018-04-12 DIAGNOSIS — I10 Essential (primary) hypertension: Secondary | ICD-10-CM | POA: Diagnosis not present

## 2018-04-12 DIAGNOSIS — N951 Menopausal and female climacteric states: Secondary | ICD-10-CM | POA: Diagnosis not present

## 2018-04-12 DIAGNOSIS — E119 Type 2 diabetes mellitus without complications: Secondary | ICD-10-CM | POA: Diagnosis not present

## 2018-04-12 DIAGNOSIS — F329 Major depressive disorder, single episode, unspecified: Secondary | ICD-10-CM | POA: Diagnosis not present

## 2018-05-03 ENCOUNTER — Telehealth: Payer: Self-pay | Admitting: Neurology

## 2018-05-03 NOTE — Telephone Encounter (Signed)
H[facility] called scheduled delivery of xeomin 05/06/18. Address verified.  FYI

## 2018-05-04 NOTE — Telephone Encounter (Signed)
Noted, thank you

## 2018-05-12 ENCOUNTER — Encounter: Payer: Self-pay | Admitting: Neurology

## 2018-05-12 ENCOUNTER — Ambulatory Visit (INDEPENDENT_AMBULATORY_CARE_PROVIDER_SITE_OTHER): Payer: PPO | Admitting: Neurology

## 2018-05-12 ENCOUNTER — Telehealth: Payer: Self-pay | Admitting: Neurology

## 2018-05-12 VITALS — BP 134/77 | HR 94 | Ht 64.0 in | Wt 146.0 lb

## 2018-05-12 DIAGNOSIS — G243 Spasmodic torticollis: Secondary | ICD-10-CM | POA: Diagnosis not present

## 2018-05-12 NOTE — Progress Notes (Signed)
PATIENT: Cassandra Decker DOB: 08/02/1945  Chief Complaint  Patient presents with  . Cervical Dystonia    Xeomin 100 units x 1 vials - specialty pharmacy     HISTORICAL  Cassandra Decker is a 73 years old right-handed female, alone at today's clinical visit, seen in refer by  Laroy Apple, her primary care Dr. Renelda Mom for evaluation of neck pain, abnormal neck posture  She had a history of hyperlipidemia, polymyalgia rheumatica, has been treated with prednisone 5 mg daily for few years, borderline diabetes, she works as Glass blower/designer for [employer]  Around 2010, she began to noticed neck discomfort, she tends to hold her neck to the right side, gradually getting worse, to the point of difficulty keeping her neck straight, tendency of head titubation, constant posterior neck pain, difficulty turning her neck to the left side, she also developed bilateral shoulder pain. She has difficulty driving, difficulty turning.  She has to hold her chin frequently to keep her neck in a straight position, she denies gait difficulty, no bowel and bladder incontinence.   She has gone through physical therapy, chiropractor, epidural injection without significant improvement   She had MRI of cervical spine at orthopedic clinic in August 2016, multilevel cervical degenerative changes, most severe at C5-6, with mild-to-moderate canal stenosis, no cord signal changes, was not able to reviewed the film  UPDATE March 28th 2017: We have personally reviewed MRI of cervical spine August 2016: Moderate multilevel cervical disc degeneration with moderate<BR>spinal stenosis at C5-6.  Mild spinal stenosis at C6-7 3. Mild-to-moderate multilevel foraminal stenosis as above.  Potential side effect of xeomin was explained to the patient, this is her first EMG guided xeomin injection  UPDATE June 29th 2017: She responded to her initial xeomin injection in March 28 very well, we used 200 units of xeomin, no  significant side effect, took about 2 weeks to take effect, the benefit lasts about 10 weeks, in the past 2 weeks, she noticed recurrent symptoms, neck pulling to her right side   UPDATE Oct 5th 2017: She received 200 units of xeomin had previous injection on September 27 2015, but she got flulike illness shortly afterwards, she did notice worsening right-sided neck pain, pulling of her neck at the end of 3 months.  UPDATE June 11 2016: She responded very well to previous 300 units xeomin injection in October 2017, denies significant side effect, but was not able to keep every 3 months injection due to high co-pay/medication cause.  UPDATE Jan 14 2017: She responded very well to previous injection in March, does has transient swallowing difficulty, change of voice,  UPDATE October 28 2017: She responded very well to previous injection, we only used xeomin 200 units  UPDATE Feb 10 2018: She responded very well to previous Xeomin injection, there was no significant side effect noticed.  UPDATE May 12 2018: She did well with previous injection, no significant side effect noted.  REVIEW OF SYSTEMS: Full 14 system review of systems performed and notable only for as above   ALLERGIES: Allergies  Allergen Reactions  . Aspirin Swelling  . Sulfa Antibiotics Hives    HOME MEDICATIONS: Current Outpatient Medications  Medication Sig Dispense Refill  . albuterol (PROVENTIL HFA;VENTOLIN HFA) 108 (90 Base) MCG/ACT inhaler INHALE 1-2 PUFFS EVERY 4 TO 6 HOURS AS NEEDED    . alendronate (FOSAMAX) 70 MG tablet Take 70 mg by mouth once a week. Take with a full glass of water on  an empty stomach.    . Cholecalciferol (VITAMIN D3) 5000 units CAPS Take by mouth daily.    . cloNIDine (CATAPRES) 0.1 MG tablet Take 0.1 mg by mouth daily.     . Coenzyme Q10 (COQ10 PO) Take by mouth daily.    . COLOSTRUM PO Take by mouth daily.    . DULoxetine (CYMBALTA) 60 MG capsule Take 60 mg by mouth daily.    Marland Kitchen FIBER  COMPLETE TABS Take by mouth. Chew 2-3 tablets daily.    Marland Kitchen incobotulinumtoxinA (XEOMIN) 100 units SOLR injection Inject 200 Units into the muscle every 3 (three) months. 2 each 3  . loratadine (CLARITIN) 10 MG tablet Take 10 mg by mouth as needed for allergies.    . meloxicam (MOBIC) 15 MG tablet Take 15 mg by mouth as needed.     . metaxalone (SKELAXIN) 800 MG tablet Take 1 tablet (800 mg total) by mouth 3 (three) times daily. (Patient taking differently: Take 800 mg by mouth 3 (three) times daily as needed. ) 90 tablet 5  . metFORMIN (GLUCOPHAGE) 500 MG tablet Take 250 mg by mouth 2 (two) times daily.  2  . montelukast (SINGULAIR) 10 MG tablet Take 10 mg by mouth at bedtime.    . Multiple Vitamins-Minerals (MULTIVITAMIN PO) Take by mouth. Take a multivitamin powder twice daily (calcium 400mg , magnesium 200mg , vitamin C 500mg ).    . Multiple Vitamins-Minerals (MULTIVITAMIN WITH MINERALS) tablet Take 1 tablet by mouth daily.    Marland Kitchen omeprazole (PRILOSEC) 20 MG capsule Take 20 mg by mouth daily.    . predniSONE (DELTASONE) 5 MG tablet TAKE ONE TABLET BY MOUTH DAILY WITH FOOD    . rosuvastatin (CRESTOR) 10 MG tablet Take 10 mg by mouth daily.    . TURMERIC PO Take by mouth daily.     No current facility-administered medications for this visit.     PAST MEDICAL HISTORY: Past Medical History:  Diagnosis Date  . Acid reflux   . Cervical stenosis of spine    Past treament with ESI and nerve blocks.  . Diabetes ([place])    Borderline - A1C 6.5  . Diverticulitis   . Hyperlipemia   . Lumbar stenosis    Past treatment with ESIs.  . Muscle spasms of neck   . Neck pain     PAST SURGICAL HISTORY: The histories are not reviewed yet. Please review them in the "History" navigator section and refresh this Strang.  FAMILY HISTORY: Family History  Problem Relation Age of Onset  . Stroke Mother   . Emphysema Father   . Cancer Paternal Grandfather        oral    SOCIAL HISTORY:  Social History     Socioeconomic History  . Marital status: Single    Spouse name: Not on file  . Number of children: 2  . Years of education: 40  . Highest education level: Not on file  Occupational History  . Occupation: Glass blower/designer  Social Needs  . Financial resource strain: Not on file  . Food insecurity:    Worry: Not on file    Inability: Not on file  . Transportation needs:    Medical: Not on file    Non-medical: Not on file  Tobacco Use  . Smoking status: Never Smoker  . Smokeless tobacco: Never Used  Substance and Sexual Activity  . Alcohol use: No    Alcohol/week: 0.0 standard drinks  . Drug use: No  . Sexual activity: Not on file  Lifestyle  . Physical activity:    Days per week: Not on file    Minutes per session: Not on file  . Stress: Not on file  Relationships  . Social connections:    Talks on phone: Not on file    Gets together: Not on file    Attends religious service: Not on file    Active member of club or organization: Not on file    Attends meetings of clubs or organizations: Not on file    Relationship status: Not on file  . Intimate partner violence:    Fear of current or ex partner: Not on file    Emotionally abused: Not on file    Physically abused: Not on file    Forced sexual activity: Not on file  Other Topics Concern  . Not on file  Social History Narrative   Lives at home with husband.   Right-handed.   1-2 cup caffeine per day.     PHYSICAL EXAM   Vitals:   05/12/18 1305  BP: 134/77  Pulse: 94  Weight: 146 lb (66.2 kg)  Height: 5\' 4"  (1.626 m)    Not recorded      Body mass index is 25.06 kg/m.  PHYSICAL EXAMNIATION:  Gen: NAD, conversant, well nourised, obese, well groomed                     Cardiovascular: Regular rate rhythm, no peripheral edema, warm, nontender. Eyes: Conjunctivae clear without exudates or hemorrhage Neck: Supple, no carotid bruise. Pulmonary: Clear to auscultation bilaterally   NEUROLOGICAL EXAM:      MENTAL STATUS: Speech:    Speech is normal; fluent and spontaneous with normal comprehension.  Cognition:     Orientation to time, place and person     Normal recent and remote memory     Normal Attention span and concentration     Normal Language, naming, repeating,spontaneous speech     Fund of knowledge   CRANIAL NERVES: CN II: Visual fields are full to confrontation.   Pupils are round equal and briskly reactive to light. CN III, IV, VI: extraocular movement are normal. No ptosis. CN V: Facial sensation is intact to pinprick in all 3 divisions bilaterally. Corneal responses are intact.  CN VII: Face is symmetric with normal eye closure and smile. CN VIII: Hearing is normal to rubbing fingers CN IX, X: Palate elevates symmetrically. Phonation is normal. CN XI: Head turning and shoulder shrug are intact CN XII: Tongue is midline with normal movements and no atrophy.  MOTOR: Her chin was pulling to her right shoulder, with moderate anterocollis, right turn, moderate left tilt.  mild left shoulder elevation. Mild neck extension weakness  There is no pronator drift of out-stretched arms. Muscle bulk and tone are normal. Muscle strength is normal.  REFLEXES: Reflexes are 2+ and symmetric at the biceps, triceps, knees, and ankles. Plantar responses are flexor.  SENSORY: Intact to light touch, pinprick, position sense, and vibration sense are intact in fingers and toes.  COORDINATION: Rapid alternating movements and fine finger movements are intact. There is no dysmetria on finger-to-nose and heel-knee-shin.    GAIT/STANCE: Posture is normal. Gait is steady with normal steps, base, arm swing, and turning. Heel and toe walking are normal. Tandem gait is normal.  Romberg is absent.   DIAGNOSTIC DATA (LABS, IMAGING, TESTING) - I reviewed patient records, labs, notes, testing and imaging myself where available.   ASSESSMENT AND PLAN  Abilene L  Bianca is a 73 y.o. female     Cervical dystonia:  She has mild to moderate anterocollis, mild right tilt, mild left shoulder elevation.   Her first EMG guided xeomin injection in March 28th 2017  Used Xeomin 100 units (100units/2cc NS) under EMG guidance   Right sternocleidomastoid 25 units (close to mastoid process) Left sternocleidomastoid 25 units  (close to mastoid process)  Left levator scapular 12.5 units Left Iliocostalis 12.5 units  Left longissimus capitis 12.5 units Right longissimus capitis 12.5 units   She tolerated the injection well,   Return in 6 months for repeat injections  Marcial Pacas, M.D. Ph.D.  [facility] [address], [place], [place] [address] Ph: [phone] Fax: [phone]   CC:Laroy Apple, MD. Dr. Nicholos Johns

## 2018-05-12 NOTE — Progress Notes (Signed)
**  Xeomin 100 units x 1 vials, NDC 9417-4081-44, Lot [id number], Exp 06/2020, specialty pharmacy.//mck,rn**

## 2018-05-12 NOTE — Telephone Encounter (Signed)
Please check on the balance of her resuming from specialty pharmacy,  Per patient's recall, specialty pharmacy should send 200 units of Xeomin, but were only going to use 100 units of Xeomin on today's injection May 12, 2018.  Please keep up the balance, if she does have extra units, next injection, we do not have to order from specialty pharmarcy

## 2018-05-13 NOTE — Telephone Encounter (Signed)
Patient got 200 units. Her medication will be kept until next apt. It has already been marked in her next apt note. She will not have to order medication for her next apt.

## 2018-06-18 ENCOUNTER — Other Ambulatory Visit: Payer: Self-pay | Admitting: Neurology

## 2018-06-30 DIAGNOSIS — R519 Headache, unspecified: Secondary | ICD-10-CM | POA: Diagnosis not present

## 2018-06-30 DIAGNOSIS — R079 Chest pain, unspecified: Secondary | ICD-10-CM | POA: Diagnosis not present

## 2018-06-30 DIAGNOSIS — M353 Polymyalgia rheumatica: Secondary | ICD-10-CM | POA: Diagnosis not present

## 2018-06-30 DIAGNOSIS — J309 Allergic rhinitis, unspecified: Secondary | ICD-10-CM | POA: Diagnosis not present

## 2018-06-30 DIAGNOSIS — I10 Essential (primary) hypertension: Secondary | ICD-10-CM | POA: Diagnosis not present

## 2018-06-30 DIAGNOSIS — N951 Menopausal and female climacteric states: Secondary | ICD-10-CM | POA: Diagnosis not present

## 2018-06-30 DIAGNOSIS — F329 Major depressive disorder, single episode, unspecified: Secondary | ICD-10-CM | POA: Diagnosis not present

## 2018-06-30 DIAGNOSIS — K219 Gastro-esophageal reflux disease without esophagitis: Secondary | ICD-10-CM | POA: Diagnosis not present

## 2018-06-30 DIAGNOSIS — E785 Hyperlipidemia, unspecified: Secondary | ICD-10-CM | POA: Diagnosis not present

## 2018-06-30 DIAGNOSIS — E119 Type 2 diabetes mellitus without complications: Secondary | ICD-10-CM | POA: Diagnosis not present

## 2018-06-30 DIAGNOSIS — J45909 Unspecified asthma, uncomplicated: Secondary | ICD-10-CM | POA: Diagnosis not present

## 2018-07-28 ENCOUNTER — Telehealth: Payer: Self-pay | Admitting: Neurology

## 2018-07-28 NOTE — Telephone Encounter (Signed)
Patient calling to if her Xeomin was her relayed yes 2 100 unit boxes

## 2018-08-18 ENCOUNTER — Telehealth: Payer: Self-pay | Admitting: Neurology

## 2018-08-18 ENCOUNTER — Ambulatory Visit (INDEPENDENT_AMBULATORY_CARE_PROVIDER_SITE_OTHER): Payer: PPO | Admitting: Neurology

## 2018-08-18 ENCOUNTER — Other Ambulatory Visit: Payer: Self-pay

## 2018-08-18 ENCOUNTER — Encounter: Payer: Self-pay | Admitting: Neurology

## 2018-08-18 VITALS — BP 140/83 | HR 72 | Temp 98.0°F | Ht 64.0 in | Wt 154.0 lb

## 2018-08-18 DIAGNOSIS — G243 Spasmodic torticollis: Secondary | ICD-10-CM | POA: Diagnosis not present

## 2018-08-18 MED ORDER — INCOBOTULINUMTOXINA 100 UNITS IM SOLR
100.0000 [IU] | INTRAMUSCULAR | Status: DC
  Administered 2018-08-18: 100 [IU] via INTRAMUSCULAR

## 2018-08-18 NOTE — Progress Notes (Signed)
**  Xeomin 100 units x 1 vial, NDC 7357-8978-47, Lot [id number], Exp 03/2020, specialty pharmacy.//mck,rn**

## 2018-08-18 NOTE — Addendum Note (Signed)
Addended by: Marcial Pacas on: 08/18/2018 02:02 PM   Modules accepted: Orders

## 2018-08-18 NOTE — Telephone Encounter (Signed)
Use xeomin 100 units for next injection

## 2018-08-18 NOTE — Telephone Encounter (Signed)
Noted. DW

## 2018-08-18 NOTE — Progress Notes (Addendum)
PATIENT: Cassandra Decker DOB: 1945/07/24  Chief Complaint  Patient presents with  . Cervical Dystonia    Xeomin 100 units x 1 vials - specialty pharmacy     HISTORICAL  Cassandra Decker is a 73 years old right-handed female, alone at today's clinical visit, seen in refer by  Laroy Apple, her primary care Dr. Renelda Mom for evaluation of neck pain, abnormal neck posture  She had a history of hyperlipidemia, polymyalgia rheumatica, has been treated with prednisone 5 mg daily for few years, borderline diabetes, she works as Glass blower/designer for [employer]  Around 2010, she began to noticed neck discomfort, she tends to hold her neck to the right side, gradually getting worse, to the point of difficulty keeping her neck straight, tendency of head titubation, constant posterior neck pain, difficulty turning her neck to the left side, she also developed bilateral shoulder pain. She has difficulty driving, difficulty turning.  She has to hold her chin frequently to keep her neck in a straight position, she denies gait difficulty, no bowel and bladder incontinence.   She has gone through physical therapy, chiropractor, epidural injection without significant improvement   She had MRI of cervical spine at orthopedic clinic in August 2016, multilevel cervical degenerative changes, most severe at C5-6, with mild-to-moderate canal stenosis, no cord signal changes, was not able to reviewed the film  UPDATE March 28th 2017: We have personally reviewed MRI of cervical spine August 2016: Moderate multilevel cervical disc degeneration with moderate<BR>spinal stenosis at C5-6.  Mild spinal stenosis at C6-7 3. Mild-to-moderate multilevel foraminal stenosis as above.  Potential side effect of xeomin was explained to the patient, this is her first EMG guided xeomin injection  UPDATE June 29th 2017: She responded to her initial xeomin injection in March 28 very well, we used 200 units of xeomin, no  significant side effect, took about 2 weeks to take effect, the benefit lasts about 10 weeks, in the past 2 weeks, she noticed recurrent symptoms, neck pulling to her right side   UPDATE Oct 5th 2017: She received 200 units of xeomin had previous injection on September 27 2015, but she got flulike illness shortly afterwards, she did notice worsening right-sided neck pain, pulling of her neck at the end of 3 months.  UPDATE June 11 2016: She responded very well to previous 300 units xeomin injection in October 2017, denies significant side effect, but was not able to keep every 3 months injection due to high co-pay/medication cause.  UPDATE Jan 14 2017: She responded very well to previous injection in March, does has transient swallowing difficulty, change of voice,  UPDATE October 28 2017: She responded very well to previous injection, we only used xeomin 200 units  UPDATE Feb 10 2018: She responded very well to previous Xeomin injection, there was no significant side effect noticed.  UPDATE May 12 2018: She did well with previous injection, no significant side effect noted.  UPDATE Aug 18 2018: She did well with previous injection, there was no significant side effect noted, we used xeomin 100 units   ASSESSMENT AND PLAN  Cervical dystonia:  She has mild to moderate anterocollis, mild right tilt, mild left shoulder elevation.   Her first EMG guided xeomin injection in March 28th 2017  Used Xeomin 100 units (100units/2cc NS) under EMG guidance   Right sternocleidomastoid 1.25x2= 25units (close to mastoid process) Left sternocleidomastoid 12.5x2=25 units  (close to mastoid process)  Right Iliocostalis 25 units  Right longissimus capitis 12.5 units Splenius capitis 12.5 units   She tolerated the injection well,   Return in 3 months for repeat injections, will use xeomin 100 units Marcial Pacas, M.D. Ph.D.  [facility] [address] [place],   [address] Ph: [phone] Fax: [phone]   CC:Laroy Apple, MD. Dr. Nicholos Johns

## 2018-10-15 DIAGNOSIS — E785 Hyperlipidemia, unspecified: Secondary | ICD-10-CM | POA: Diagnosis not present

## 2018-10-15 DIAGNOSIS — Z79899 Other long term (current) drug therapy: Secondary | ICD-10-CM | POA: Diagnosis not present

## 2018-10-15 DIAGNOSIS — E559 Vitamin D deficiency, unspecified: Secondary | ICD-10-CM | POA: Diagnosis not present

## 2018-10-15 DIAGNOSIS — E119 Type 2 diabetes mellitus without complications: Secondary | ICD-10-CM | POA: Diagnosis not present

## 2018-10-15 DIAGNOSIS — E039 Hypothyroidism, unspecified: Secondary | ICD-10-CM | POA: Diagnosis not present

## 2018-10-19 DIAGNOSIS — E039 Hypothyroidism, unspecified: Secondary | ICD-10-CM | POA: Diagnosis not present

## 2018-10-19 DIAGNOSIS — M353 Polymyalgia rheumatica: Secondary | ICD-10-CM | POA: Diagnosis not present

## 2018-10-19 DIAGNOSIS — R51 Headache: Secondary | ICD-10-CM | POA: Diagnosis not present

## 2018-10-19 DIAGNOSIS — E119 Type 2 diabetes mellitus without complications: Secondary | ICD-10-CM | POA: Diagnosis not present

## 2018-10-19 DIAGNOSIS — I10 Essential (primary) hypertension: Secondary | ICD-10-CM | POA: Diagnosis not present

## 2018-11-01 DIAGNOSIS — M353 Polymyalgia rheumatica: Secondary | ICD-10-CM | POA: Diagnosis not present

## 2018-11-01 DIAGNOSIS — R51 Headache: Secondary | ICD-10-CM | POA: Diagnosis not present

## 2018-11-01 DIAGNOSIS — G243 Spasmodic torticollis: Secondary | ICD-10-CM | POA: Diagnosis not present

## 2018-11-08 DIAGNOSIS — M316 Other giant cell arteritis: Secondary | ICD-10-CM | POA: Diagnosis not present

## 2018-11-11 DIAGNOSIS — Z01812 Encounter for preprocedural laboratory examination: Secondary | ICD-10-CM | POA: Diagnosis not present

## 2018-11-11 DIAGNOSIS — Z01818 Encounter for other preprocedural examination: Secondary | ICD-10-CM | POA: Diagnosis not present

## 2018-11-11 DIAGNOSIS — Z1159 Encounter for screening for other viral diseases: Secondary | ICD-10-CM | POA: Diagnosis not present

## 2018-11-11 DIAGNOSIS — Z20828 Contact with and (suspected) exposure to other viral communicable diseases: Secondary | ICD-10-CM | POA: Diagnosis not present

## 2018-11-11 DIAGNOSIS — M316 Other giant cell arteritis: Secondary | ICD-10-CM | POA: Diagnosis not present

## 2018-11-16 DIAGNOSIS — L671 Variations in hair color: Secondary | ICD-10-CM | POA: Diagnosis not present

## 2018-11-16 DIAGNOSIS — R700 Elevated erythrocyte sedimentation rate: Secondary | ICD-10-CM | POA: Diagnosis not present

## 2018-11-16 DIAGNOSIS — M316 Other giant cell arteritis: Secondary | ICD-10-CM | POA: Diagnosis not present

## 2018-11-16 DIAGNOSIS — R7982 Elevated C-reactive protein (CRP): Secondary | ICD-10-CM | POA: Diagnosis not present

## 2018-11-16 DIAGNOSIS — R51 Headache: Secondary | ICD-10-CM | POA: Diagnosis not present

## 2018-11-22 DIAGNOSIS — M353 Polymyalgia rheumatica: Secondary | ICD-10-CM | POA: Diagnosis not present

## 2018-11-22 DIAGNOSIS — E119 Type 2 diabetes mellitus without complications: Secondary | ICD-10-CM | POA: Diagnosis not present

## 2018-11-22 DIAGNOSIS — Z6826 Body mass index (BMI) 26.0-26.9, adult: Secondary | ICD-10-CM | POA: Diagnosis not present

## 2018-11-22 DIAGNOSIS — E663 Overweight: Secondary | ICD-10-CM | POA: Diagnosis not present

## 2018-11-23 ENCOUNTER — Ambulatory Visit: Payer: PPO | Admitting: Neurology

## 2018-11-30 DIAGNOSIS — R7982 Elevated C-reactive protein (CRP): Secondary | ICD-10-CM | POA: Diagnosis not present

## 2018-11-30 DIAGNOSIS — M353 Polymyalgia rheumatica: Secondary | ICD-10-CM | POA: Diagnosis not present

## 2018-12-07 DIAGNOSIS — Z79899 Other long term (current) drug therapy: Secondary | ICD-10-CM | POA: Diagnosis not present

## 2018-12-07 DIAGNOSIS — R21 Rash and other nonspecific skin eruption: Secondary | ICD-10-CM | POA: Diagnosis not present

## 2018-12-07 DIAGNOSIS — E119 Type 2 diabetes mellitus without complications: Secondary | ICD-10-CM | POA: Diagnosis not present

## 2018-12-08 DIAGNOSIS — Z79899 Other long term (current) drug therapy: Secondary | ICD-10-CM | POA: Diagnosis not present

## 2018-12-08 DIAGNOSIS — E119 Type 2 diabetes mellitus without complications: Secondary | ICD-10-CM | POA: Diagnosis not present

## 2018-12-08 DIAGNOSIS — R21 Rash and other nonspecific skin eruption: Secondary | ICD-10-CM | POA: Diagnosis not present

## 2018-12-09 DIAGNOSIS — M79671 Pain in right foot: Secondary | ICD-10-CM | POA: Diagnosis not present

## 2018-12-09 DIAGNOSIS — L600 Ingrowing nail: Secondary | ICD-10-CM | POA: Diagnosis not present

## 2018-12-09 DIAGNOSIS — M84374A Stress fracture, right foot, initial encounter for fracture: Secondary | ICD-10-CM | POA: Diagnosis not present

## 2018-12-13 ENCOUNTER — Ambulatory Visit (INDEPENDENT_AMBULATORY_CARE_PROVIDER_SITE_OTHER): Payer: PPO | Admitting: Neurology

## 2018-12-13 ENCOUNTER — Telehealth: Payer: Self-pay | Admitting: Neurology

## 2018-12-13 ENCOUNTER — Other Ambulatory Visit: Payer: Self-pay

## 2018-12-13 ENCOUNTER — Encounter: Payer: Self-pay | Admitting: Neurology

## 2018-12-13 VITALS — BP 128/74 | HR 90 | Temp 97.9°F | Ht 64.0 in | Wt 140.5 lb

## 2018-12-13 DIAGNOSIS — G243 Spasmodic torticollis: Secondary | ICD-10-CM

## 2018-12-13 NOTE — Telephone Encounter (Signed)
Noted, we did not order for this round, we used the left over 100 units from her last round. We will need to place an order for the next one.

## 2018-12-13 NOTE — Telephone Encounter (Signed)
Please check on her xeomin supply, would she need to order for next injection, I  just use xeomin 100 units each time, please just order 100 unit to avoid confusion. (previously, we ordered 200 units)

## 2018-12-13 NOTE — Progress Notes (Signed)
PATIENT: Cassandra Decker DOB: 1945/07/05  Chief Complaint  Patient presents with  . Cervical Dystonia    Xeomin 100 units x 1 vials - specialty pharmacy     HISTORICAL  Cassandra Decker is a 73 years old right-handed female, alone at today's clinical visit, seen in refer by  Laroy Apple, her primary care Dr. Renelda Mom for evaluation of neck pain, abnormal neck posture  She had a history of hyperlipidemia, polymyalgia rheumatica, has been treated with prednisone 5 mg daily for few years, borderline diabetes, she works as Glass blower/designer for [employer]  Around 2010, she began to noticed neck discomfort, she tends to hold her neck to the right side, gradually getting worse, to the point of difficulty keeping her neck straight, tendency of head titubation, constant posterior neck pain, difficulty turning her neck to the left side, she also developed bilateral shoulder pain. She has difficulty driving, difficulty turning.  She has to hold her chin frequently to keep her neck in a straight position, she denies gait difficulty, no bowel and bladder incontinence.   She has gone through physical therapy, chiropractor, epidural injection without significant improvement   She had MRI of cervical spine at orthopedic clinic in August 2016, multilevel cervical degenerative changes, most severe at C5-6, with mild-to-moderate canal stenosis, no cord signal changes, was not able to reviewed the film  UPDATE March 28th 2017: We have personally reviewed MRI of cervical spine August 2016: Moderate multilevel cervical disc degeneration with moderate<BR>spinal stenosis at C5-6.  Mild spinal stenosis at C6-7 3. Mild-to-moderate multilevel foraminal stenosis as above.  Potential side effect of xeomin was explained to the patient, this is her first EMG guided xeomin injection  UPDATE June 29th 2017: She responded to her initial xeomin injection in March 28 very well, we used 200 units of xeomin, no  significant side effect, took about 2 weeks to take effect, the benefit lasts about 10 weeks, in the past 2 weeks, she noticed recurrent symptoms, neck pulling to her right side   UPDATE Oct 5th 2017: She received 200 units of xeomin had previous injection on September 27 2015, but she got flulike illness shortly afterwards, she did notice worsening right-sided neck pain, pulling of her neck at the end of 3 months.  UPDATE June 11 2016: She responded very well to previous 300 units xeomin injection in October 2017, denies significant side effect, but was not able to keep every 3 months injection due to high co-pay/medication cause.  UPDATE Jan 14 2017: She responded very well to previous injection in March, does has transient swallowing difficulty, change of voice,  UPDATE October 28 2017: She responded very well to previous injection, we only used xeomin 200 units  UPDATE Feb 10 2018: She responded very well to previous Xeomin injection, there was no significant side effect noticed.  UPDATE May 12 2018: She did well with previous injection, no significant side effect noted.  UPDATE Aug 18 2018: She did well with previous injection, there was no significant side effect noted, we used xeomin 100 units  UPDATE Sept 14 2020: She did well with previous injection, she was recently treated with steroid for polymyalgia rheumatica, right temporal artery biopsy was negative, also suffered elevated glucose, right foot stress fracture without injury  ASSESSMENT AND PLAN  Cervical dystonia:  She has mild anterocollis, mild right tilt, mild left shoulder elevation.   Her first EMG guided xeomin injection in March 28th 2017  Used  Xeomin 100 units (100units/2cc NS) under EMG guidance   Right sternocleidomastoid 12.5  (close to mastoid process) Left sternocleidomastoid 12.5  (close to mastoid process)  Right levator scapula 12.5 units Left levator scapula 12.5 units  Right longissimus capitis  12.5 units Right splenius capitis 12.5 units  Left splenius cervix 10 units  Right corrugate 5 units Left corrugate 5 units Prosperous 5 units   She tolerated the injection well,   Return in 3 months for repeat injections, will use xeomin 100 units  Marcial Pacas, M.D. Ph.D.  [facility] [address], [place], [place] [address] Ph: [phone] Fax: [phone]   CC:Laroy Apple, MD. Dr. Nicholos Johns

## 2018-12-13 NOTE — Progress Notes (Signed)
**  Xeomin 100 units x 1 vial, NDC [id number], Lot [id number], Exp 06/2020, specialty pharmacy.//mck,rn**

## 2018-12-23 DIAGNOSIS — M84374D Stress fracture, right foot, subsequent encounter for fracture with routine healing: Secondary | ICD-10-CM | POA: Diagnosis not present

## 2018-12-23 DIAGNOSIS — L600 Ingrowing nail: Secondary | ICD-10-CM | POA: Diagnosis not present

## 2019-01-12 DIAGNOSIS — E119 Type 2 diabetes mellitus without complications: Secondary | ICD-10-CM | POA: Diagnosis not present

## 2019-01-12 DIAGNOSIS — M353 Polymyalgia rheumatica: Secondary | ICD-10-CM | POA: Diagnosis not present

## 2019-01-12 DIAGNOSIS — Z6824 Body mass index (BMI) 24.0-24.9, adult: Secondary | ICD-10-CM | POA: Diagnosis not present

## 2019-01-12 DIAGNOSIS — E559 Vitamin D deficiency, unspecified: Secondary | ICD-10-CM | POA: Diagnosis not present

## 2019-01-12 DIAGNOSIS — I10 Essential (primary) hypertension: Secondary | ICD-10-CM | POA: Diagnosis not present

## 2019-01-12 DIAGNOSIS — R519 Headache, unspecified: Secondary | ICD-10-CM | POA: Diagnosis not present

## 2019-01-12 DIAGNOSIS — Z23 Encounter for immunization: Secondary | ICD-10-CM | POA: Diagnosis not present

## 2019-01-12 DIAGNOSIS — E039 Hypothyroidism, unspecified: Secondary | ICD-10-CM | POA: Diagnosis not present

## 2019-01-18 DIAGNOSIS — M84374D Stress fracture, right foot, subsequent encounter for fracture with routine healing: Secondary | ICD-10-CM | POA: Diagnosis not present

## 2019-03-09 ENCOUNTER — Telehealth: Payer: Self-pay | Admitting: Neurology

## 2019-03-09 NOTE — Telephone Encounter (Signed)
Ivin Booty with [facility] called to schedule delivery for patients medication. Please follow up

## 2019-03-10 NOTE — Telephone Encounter (Signed)
I called and scheduled the delivery.

## 2019-03-14 ENCOUNTER — Telehealth: Payer: Self-pay | Admitting: Neurology

## 2019-03-14 NOTE — Telephone Encounter (Signed)
Pharmacist Verdis Frederickson from [facility] re: the Xeomin is asking for a call to discuss this medication for pt

## 2019-03-14 NOTE — Telephone Encounter (Signed)
I called and spoke with the pharmacist.

## 2019-03-17 ENCOUNTER — Encounter: Payer: Self-pay | Admitting: Neurology

## 2019-03-17 ENCOUNTER — Other Ambulatory Visit: Payer: Self-pay

## 2019-03-17 ENCOUNTER — Ambulatory Visit (INDEPENDENT_AMBULATORY_CARE_PROVIDER_SITE_OTHER): Payer: PPO | Admitting: Neurology

## 2019-03-17 ENCOUNTER — Telehealth: Payer: Self-pay | Admitting: Neurology

## 2019-03-17 VITALS — BP 119/72 | HR 86 | Temp 97.3°F | Ht 64.0 in | Wt 136.0 lb

## 2019-03-17 DIAGNOSIS — G243 Spasmodic torticollis: Secondary | ICD-10-CM | POA: Diagnosis not present

## 2019-03-17 NOTE — Progress Notes (Signed)
**  Xeomin 100 units x 1 vial, NDC [id number], Lot [id number], Exp 09/2020, [facility] pharmacy.//mck,rn**

## 2019-03-17 NOTE — Telephone Encounter (Signed)
Only use xeomin 100 units for each injection, please adjust her Rx for next injection in 2021

## 2019-03-17 NOTE — Progress Notes (Signed)
PATIENT: Cassandra Decker DOB: [birth year]  Chief Complaint  Patient presents with  . Cervical Dystonia    Xeomin 100 units x 1 vials - specialty pharmacy     HISTORICAL  Cassandra Decker is a 73 years old right-handed female, alone at today's clinical visit, seen in refer by  Laroy Apple, her primary care Dr. Renelda Mom for evaluation of neck pain, abnormal neck posture  She had a history of hyperlipidemia, polymyalgia rheumatica, has been treated with prednisone 5 mg daily for few years, borderline diabetes, she works as Glass blower/designer for [employer]  Around 2010, she began to noticed neck discomfort, she tends to hold her neck to the right side, gradually getting worse, to the point of difficulty keeping her neck straight, tendency of head titubation, constant posterior neck pain, difficulty turning her neck to the left side, she also developed bilateral shoulder pain. She has difficulty driving, difficulty turning.  She has to hold her chin frequently to keep her neck in a straight position, she denies gait difficulty, no bowel and bladder incontinence.   She has gone through physical therapy, chiropractor, epidural injection without significant improvement   She had MRI of cervical spine at orthopedic clinic in August 2016, multilevel cervical degenerative changes, most severe at C5-6, with mild-to-moderate canal stenosis, no cord signal changes, was not able to reviewed the film  UPDATE March 28th 2017: We have personally reviewed MRI of cervical spine August 2016: Moderate multilevel cervical disc degeneration with moderate<BR>spinal stenosis at C5-6.  Mild spinal stenosis at C6-7 3. Mild-to-moderate multilevel foraminal stenosis as above.  Potential side effect of xeomin was explained to the patient, this is her first EMG guided xeomin injection  UPDATE June 29th 2017: She responded to her initial xeomin injection in March 28 very well, we used 200 units of xeomin, no  significant side effect, took about 2 weeks to take effect, the benefit lasts about 10 weeks, in the past 2 weeks, she noticed recurrent symptoms, neck pulling to her right side   UPDATE Oct 5th 2017: She received 200 units of xeomin had previous injection on September 27 2015, but she got flulike illness shortly afterwards, she did notice worsening right-sided neck pain, pulling of her neck at the end of 3 months.  UPDATE June 11 2016: She responded very well to previous 300 units xeomin injection in October 2017, denies significant side effect, but was not able to keep every 3 months injection due to high co-pay/medication cause.  UPDATE Jan 14 2017: She responded very well to previous injection in March, does has transient swallowing difficulty, change of voice,  UPDATE October 28 2017: She responded very well to previous injection, we only used xeomin 200 units  UPDATE Feb 10 2018: She responded very well to previous Xeomin injection, there was no significant side effect noticed.  UPDATE May 12 2018: She did well with previous injection, no significant side effect noted.  UPDATE Aug 18 2018: She did well with previous injection, there was no significant side effect noted, we used xeomin 100 units  UPDATE Sept 14 2020: She did well with previous injection, she was recently treated with steroid for polymyalgia rheumatica, right temporal artery biopsy was negative, also suffered elevated glucose, right foot stress fracture without injury  UPDATE Mar 17 2019: She did well with previous injection in September 2020   ASSESSMENT AND PLAN  Cervical dystonia:  She has mild anterocollis, mild right tilt, mild left shoulder  elevation.   Her first EMG guided xeomin injection in March 28th 2017  Used Xeomin 100 units (100units/2cc NS) under EMG guidance   Right sternocleidomastoid 12.5  (close to mastoid process) Left sternocleidomastoid 12.5  (close to mastoid process)  Right levator  scapula 25 units Left levator scapula 25 units  Right longissimus capitis 12.5 units Right splenius capitis 12.5 units   She tolerated the injection well,   Return in 3 months for repeat injections, will use xeomin 100 units  Marcial Pacas, M.D. Ph.D.  [facility] [address] [place], [place] [address] Ph: [phone] Fax: [phone]   CC:Laroy Apple, MD. Dr. Nicholos Johns

## 2019-03-18 NOTE — Telephone Encounter (Signed)
Noted, I called and spoke with the patient to let her know we will save her remaining units for next time so she does not need to place another order.   Sharyn Lull could you send a scrip for 100 units for me?

## 2019-03-21 ENCOUNTER — Other Ambulatory Visit: Payer: Self-pay | Admitting: *Deleted

## 2019-03-21 MED ORDER — XEOMIN 100 UNITS IM SOLR: 100.0000 [IU] | INTRAMUSCULAR | 3 refills | Status: DC

## 2019-03-21 NOTE — Telephone Encounter (Signed)
New rx sent to her specialty mail order pharmacy.

## 2019-03-21 NOTE — Telephone Encounter (Signed)
Noted, thank you

## 2019-04-13 DIAGNOSIS — E119 Type 2 diabetes mellitus without complications: Secondary | ICD-10-CM | POA: Diagnosis not present

## 2019-04-15 DIAGNOSIS — R519 Headache, unspecified: Secondary | ICD-10-CM | POA: Diagnosis not present

## 2019-04-15 DIAGNOSIS — I10 Essential (primary) hypertension: Secondary | ICD-10-CM | POA: Diagnosis not present

## 2019-04-15 DIAGNOSIS — Z6824 Body mass index (BMI) 24.0-24.9, adult: Secondary | ICD-10-CM | POA: Diagnosis not present

## 2019-04-15 DIAGNOSIS — E039 Hypothyroidism, unspecified: Secondary | ICD-10-CM | POA: Diagnosis not present

## 2019-04-15 DIAGNOSIS — M353 Polymyalgia rheumatica: Secondary | ICD-10-CM | POA: Diagnosis not present

## 2019-04-15 DIAGNOSIS — E559 Vitamin D deficiency, unspecified: Secondary | ICD-10-CM | POA: Diagnosis not present

## 2019-04-15 DIAGNOSIS — E119 Type 2 diabetes mellitus without complications: Secondary | ICD-10-CM | POA: Diagnosis not present

## 2019-04-15 DIAGNOSIS — Z79899 Other long term (current) drug therapy: Secondary | ICD-10-CM | POA: Diagnosis not present

## 2019-04-29 DIAGNOSIS — Z79899 Other long term (current) drug therapy: Secondary | ICD-10-CM | POA: Diagnosis not present

## 2019-04-29 DIAGNOSIS — E119 Type 2 diabetes mellitus without complications: Secondary | ICD-10-CM | POA: Diagnosis not present

## 2019-04-29 DIAGNOSIS — E559 Vitamin D deficiency, unspecified: Secondary | ICD-10-CM | POA: Diagnosis not present

## 2019-04-29 DIAGNOSIS — E039 Hypothyroidism, unspecified: Secondary | ICD-10-CM | POA: Diagnosis not present

## 2019-05-09 DIAGNOSIS — Z86010 Personal history of colonic polyps: Secondary | ICD-10-CM | POA: Diagnosis not present

## 2019-05-09 DIAGNOSIS — Z800 Family history of malignant neoplasm of digestive organs: Secondary | ICD-10-CM | POA: Diagnosis not present

## 2019-05-09 DIAGNOSIS — K635 Polyp of colon: Secondary | ICD-10-CM | POA: Diagnosis not present

## 2019-05-09 DIAGNOSIS — Z1211 Encounter for screening for malignant neoplasm of colon: Secondary | ICD-10-CM | POA: Diagnosis not present

## 2019-05-09 DIAGNOSIS — D120 Benign neoplasm of cecum: Secondary | ICD-10-CM | POA: Diagnosis not present

## 2019-06-14 ENCOUNTER — Telehealth: Payer: Self-pay | Admitting: Neurology

## 2019-06-14 NOTE — Telephone Encounter (Signed)
Phone rep checked office voicemail's, at 12:15 pt left message stating she is getting her vaccine on Friday and wants to know if this will have any impact on her Botox next week, please call

## 2019-06-14 NOTE — Telephone Encounter (Signed)
Called pt. Advised it is okay for her to proceed with covid-19 vaccine. She can keep botox appt as planned next week. This will be her first dose. Nothing further needed.

## 2019-06-22 ENCOUNTER — Ambulatory Visit (INDEPENDENT_AMBULATORY_CARE_PROVIDER_SITE_OTHER): Payer: PPO | Admitting: Neurology

## 2019-06-22 ENCOUNTER — Telehealth: Payer: Self-pay | Admitting: *Deleted

## 2019-06-22 ENCOUNTER — Encounter: Payer: Self-pay | Admitting: Neurology

## 2019-06-22 ENCOUNTER — Telehealth: Payer: Self-pay | Admitting: Neurology

## 2019-06-22 ENCOUNTER — Other Ambulatory Visit: Payer: Self-pay

## 2019-06-22 VITALS — BP 123/71 | HR 68 | Temp 97.1°F | Ht 64.0 in | Wt 135.5 lb

## 2019-06-22 DIAGNOSIS — G243 Spasmodic torticollis: Secondary | ICD-10-CM

## 2019-06-22 MED ORDER — INCOBOTULINUMTOXINA 100 UNITS IM SOLR: 300.0000 [IU] | INTRAMUSCULAR | 4 refills | Status: DC

## 2019-06-22 NOTE — Progress Notes (Signed)
**  Xeomin 100 units x 1 vial, NDC [id number], Lot [id number], Exp 09/2020, specialty pharmacy.//mck,rn**

## 2019-06-22 NOTE — Addendum Note (Signed)
Addended by: Marcial Pacas on: 06/22/2019 02:31 PM   Modules accepted: Orders

## 2019-06-22 NOTE — Telephone Encounter (Addendum)
I have ordered following, please verify with patient and let her know.  incobotulinumtoxinA (XEOMIN) 100 units SOLR injection 300 Units, Every 3 months  Summary: Inject 300 Units into the muscle every 3 (three) months., Starting Wed 06/22/2019, Normal Dose, Route, Frequency: 300 Units, Intramuscular, Every 3 months  Start: 06/22/2019  Ord/Sold: 06/22/2019 (O)  Pharmacy: [facility]  Report  Taking:  Long-term:  Med Dose History  ChangeReorderDiscontinue   Patient Sig: Inject 300 Units into the muscle every 3 (three) months.   Ordered on: 06/22/2019   Authorized by: Marcial Pacas   Dispense: 1 each   Refills: 4 ordered

## 2019-06-22 NOTE — Progress Notes (Signed)
PATIENT: Cassandra Decker DOB: 1946-03-11  Chief Complaint  Patient presents with  . Cervical Dystonia    Xeomin 100 units x 1 vials - specialty pharmacy     HISTORICAL  Cassandra Decker is a 74 years old right-handed female, alone at today's clinical visit, seen in refer by  Laroy Apple, her primary care Dr. Renelda Mom for evaluation of neck pain, abnormal neck posture  She had a history of hyperlipidemia, polymyalgia rheumatica, has been treated with prednisone 5 mg daily for few years, borderline diabetes, she works as Glass blower/designer for [employer]  Around 2010, she began to noticed neck discomfort, she tends to hold her neck to the right side, gradually getting worse, to the point of difficulty keeping her neck straight, tendency of head titubation, constant posterior neck pain, difficulty turning her neck to the left side, she also developed bilateral shoulder pain. She has difficulty driving, difficulty turning.  She has to hold her chin frequently to keep her neck in a straight position, she denies gait difficulty, no bowel and bladder incontinence.   She has gone through physical therapy, chiropractor, epidural injection without significant improvement   She had MRI of cervical spine at orthopedic clinic in August 2016, multilevel cervical degenerative changes, most severe at C5-6, with mild-to-moderate canal stenosis, no cord signal changes, was not able to reviewed the film  UPDATE March 28th 2017: We have personally reviewed MRI of cervical spine August 2016: Moderate multilevel cervical disc degeneration with moderate<BR>spinal stenosis at C5-6.  Mild spinal stenosis at C6-7 3. Mild-to-moderate multilevel foraminal stenosis as above.  Potential side effect of xeomin was explained to the patient, this is her first EMG guided xeomin injection  UPDATE June 29th 2017: She responded to her initial xeomin injection in March 28 very well, we used 200 units of xeomin, no  significant side effect, took about 2 weeks to take effect, the benefit lasts about 10 weeks, in the past 2 weeks, she noticed recurrent symptoms, neck pulling to her right side   UPDATE Oct 5th 2017: She received 200 units of xeomin had previous injection on September 27 2015, but she got flulike illness shortly afterwards, she did notice worsening right-sided neck pain, pulling of her neck at the end of 3 months.  UPDATE June 11 2016: She responded very well to previous 300 units xeomin injection in October 2017, denies significant side effect, but was not able to keep every 3 months injection due to high co-pay/medication cause.  UPDATE Jan 14 2017: She responded very well to previous injection in March, does has transient swallowing difficulty, change of voice,  UPDATE October 28 2017: She responded very well to previous injection, we only used xeomin 200 units  UPDATE Feb 10 2018: She responded very well to previous Xeomin injection, there was no significant side effect noticed.  UPDATE May 12 2018: She did well with previous injection, no significant side effect noted.  UPDATE Aug 18 2018: She did well with previous injection, there was no significant side effect noted, we used xeomin 100 units  UPDATE Sept 14 2020: She did well with previous injection, she was recently treated with steroid for polymyalgia rheumatica, right temporal artery biopsy was negative, also suffered elevated glucose, right foot stress fracture without injury  UPDATE Mar 17 2019: She did well with previous injection in September 2020  UPDATE June 22 2019: She responded very well to previous injection  ASSESSMENT AND PLAN  Cervical dystonia:  She has mild to moderate anterocollis, mild right tilt, mild left shoulder elevation.   Her first EMG guided xeomin injection in March 28th 2017  Used Xeomin 100 units (100units/2cc NS) under EMG guidance (very active CRD discharges at bilateral two third of  sternocleidomastoid)   Right sternocleidomastoid 12.5 x3= 37.5 Left sternocleidomastoid 12.5  x2= 25  Right levator scapula 12.5 Right splenius capitis 12.5 Right splenius cervix 12.5    She tolerated the injection well,   Return in 3 months for repeat injections   Marcial Pacas, M.D. Ph.D.  [facility] [address] [place], [place] [address] Ph: [phone] Fax: [phone]   CC:Laroy Apple, MD. Dr. Nicholos Johns

## 2019-06-22 NOTE — Telephone Encounter (Signed)
I called [facility] and spoke to [place].  He states that [phone] and [phone] are billable and do not require PA.  Ref# for this call is [id number].

## 2019-06-23 ENCOUNTER — Telehealth: Payer: Self-pay | Admitting: *Deleted

## 2019-06-23 NOTE — Telephone Encounter (Signed)
Cassandra Decker with [facility] called to schedule Xeomin delivery.  Delivery scheduled for 06/29/2019

## 2019-06-27 NOTE — Telephone Encounter (Signed)
Anderson Malta with [facility] LVM requesting a cb in regards to the pts xeomin prescription states the dosage is different than in December and if updated prescription is correct they will be unable to send as they do not have enough qty for new dosage

## 2019-06-28 NOTE — Telephone Encounter (Signed)
Cassandra Decker with [facility] called back.  She needed to clarify Xeomin RX. It was written to dispense one but was increased to 300 units therefore needs to be dispense 3.  I gave a verbal okay based on phone note dated 06/22/2019

## 2019-06-29 NOTE — Telephone Encounter (Signed)
Patient called to inform [facility] is requesting a CB to clarify medication states they have been trying to call but have not been able to reach the office cb#[phone]

## 2019-08-01 DIAGNOSIS — E119 Type 2 diabetes mellitus without complications: Secondary | ICD-10-CM | POA: Diagnosis not present

## 2019-08-01 DIAGNOSIS — Z6824 Body mass index (BMI) 24.0-24.9, adult: Secondary | ICD-10-CM | POA: Diagnosis not present

## 2019-08-01 DIAGNOSIS — J309 Allergic rhinitis, unspecified: Secondary | ICD-10-CM | POA: Diagnosis not present

## 2019-08-01 DIAGNOSIS — M25561 Pain in right knee: Secondary | ICD-10-CM | POA: Diagnosis not present

## 2019-08-01 DIAGNOSIS — E039 Hypothyroidism, unspecified: Secondary | ICD-10-CM | POA: Diagnosis not present

## 2019-08-01 DIAGNOSIS — E559 Vitamin D deficiency, unspecified: Secondary | ICD-10-CM | POA: Diagnosis not present

## 2019-08-01 DIAGNOSIS — K219 Gastro-esophageal reflux disease without esophagitis: Secondary | ICD-10-CM | POA: Diagnosis not present

## 2019-08-01 DIAGNOSIS — R519 Headache, unspecified: Secondary | ICD-10-CM | POA: Diagnosis not present

## 2019-08-01 DIAGNOSIS — M353 Polymyalgia rheumatica: Secondary | ICD-10-CM | POA: Diagnosis not present

## 2019-08-01 DIAGNOSIS — M25461 Effusion, right knee: Secondary | ICD-10-CM | POA: Diagnosis not present

## 2019-08-01 DIAGNOSIS — I10 Essential (primary) hypertension: Secondary | ICD-10-CM | POA: Diagnosis not present

## 2019-08-01 DIAGNOSIS — N951 Menopausal and female climacteric states: Secondary | ICD-10-CM | POA: Diagnosis not present

## 2019-08-09 DIAGNOSIS — Z139 Encounter for screening, unspecified: Secondary | ICD-10-CM | POA: Diagnosis not present

## 2019-08-09 DIAGNOSIS — Z1231 Encounter for screening mammogram for malignant neoplasm of breast: Secondary | ICD-10-CM | POA: Diagnosis not present

## 2019-08-09 DIAGNOSIS — N959 Unspecified menopausal and perimenopausal disorder: Secondary | ICD-10-CM | POA: Diagnosis not present

## 2019-08-09 DIAGNOSIS — Z1331 Encounter for screening for depression: Secondary | ICD-10-CM | POA: Diagnosis not present

## 2019-08-09 DIAGNOSIS — Z9181 History of falling: Secondary | ICD-10-CM | POA: Diagnosis not present

## 2019-08-09 DIAGNOSIS — E785 Hyperlipidemia, unspecified: Secondary | ICD-10-CM | POA: Diagnosis not present

## 2019-08-22 DIAGNOSIS — M25561 Pain in right knee: Secondary | ICD-10-CM | POA: Diagnosis not present

## 2019-09-20 ENCOUNTER — Telehealth: Payer: Self-pay | Admitting: Neurology

## 2019-09-20 NOTE — Telephone Encounter (Signed)
Pt has called and the message from Lovena Le was read to her,pt will call [facility] [phone])

## 2019-09-20 NOTE — Telephone Encounter (Signed)
Cassandra Decker has an appointment on 7/7. I called [facility] [phone]) and spoke with Cassandra Decker to see about scheduling delivery of Xeomin. Cassandra Decker states that they have not been able to reach Cassandra Decker to get her consent for shipment. After our call, I called Cassandra Decker and LVM requesting that she call the office so I can speak with her about the specialty pharmacy. Cassandra Decker needs to call [facility] to give consent.

## 2019-09-20 NOTE — Telephone Encounter (Signed)
I spoke to the patient. She is getting Xeomin 100 units injected at each visit. Dr. Krista Blue has been asking to order 300mg  units for each shipment because the total price is only $180.00 (same price as only one vial at a time). The patient states she should still have two vials already here at our office for her next two injections. Also, she went ahead and approved for three more vials to be shipped.

## 2019-09-20 NOTE — Telephone Encounter (Signed)
Pt called back after speaking with [facility]. Please call back when available.

## 2019-09-21 NOTE — Telephone Encounter (Signed)
I called the patient and LVM to let her know that we have (3) 100U vials of Xeomin here already from [facility] for her upcoming appointment. I advised her to call me back with any questions.

## 2019-09-26 DIAGNOSIS — M25561 Pain in right knee: Secondary | ICD-10-CM | POA: Diagnosis not present

## 2019-09-29 NOTE — Telephone Encounter (Signed)
noted 

## 2019-09-29 NOTE — Telephone Encounter (Signed)
Per Sherron Ales called and scheduled delivery for Xeomin to be delivered 7/7. Patient already has stock for upcoming appointment here. This delivery by [facility] will be used at patient's next appointment in October. FYI

## 2019-10-05 ENCOUNTER — Encounter: Payer: Self-pay | Admitting: Neurology

## 2019-10-05 ENCOUNTER — Ambulatory Visit: Payer: PPO | Admitting: Neurology

## 2019-10-05 ENCOUNTER — Other Ambulatory Visit: Payer: Self-pay

## 2019-10-05 VITALS — BP 132/80 | HR 85 | Ht 64.0 in | Wt 138.0 lb

## 2019-10-05 DIAGNOSIS — G243 Spasmodic torticollis: Secondary | ICD-10-CM | POA: Diagnosis not present

## 2019-10-05 NOTE — Telephone Encounter (Signed)
(  3) 100U vials of Xeomin delivered today from [facility]. After today's injection, patient will have a total of 5 vials here. Unless dosage changes, this is enough for the next 5 injections. FYI

## 2019-10-05 NOTE — Telephone Encounter (Signed)
UPDATE:  Dr. Krista Blue injected Xeomin 200 units at today's visit. This will leave the patient with 4 remaining vials here in the office. She is going to re-evaluate her in three months and will determine the amount to be used at that visit.

## 2019-10-05 NOTE — Progress Notes (Signed)
**  Xeomin 100 units x 2 vials, NDC 4825-0037-04, Lot [id number], Exp 02/2021, specialty pharmacy.//mck,rn**

## 2019-10-05 NOTE — Progress Notes (Signed)
PATIENT: Cassandra Decker DOB: December 17, 1945  Chief Complaint  Patient presents with  . Cervical Dystonia    Xeomin 100 units x 1 vials - specialty pharmacy     HISTORICAL  Cassandra Decker is a 74 years old right-handed female, alone at today's clinical visit, seen in refer by  Cassandra Decker, her primary care Dr. Renelda Decker for evaluation of neck pain, abnormal neck posture  She had a history of hyperlipidemia, polymyalgia rheumatica, has been treated with prednisone 5 mg daily for few years, borderline diabetes, she works as Glass blower/designer for [employer]  Around 2010, she began to noticed neck discomfort, she tends to hold her neck to the right side, gradually getting worse, to the point of difficulty keeping her neck straight, tendency of head titubation, constant posterior neck pain, difficulty turning her neck to the left side, she also developed bilateral shoulder pain. She has difficulty driving, difficulty turning.  She has to hold her chin frequently to keep her neck in a straight position, she denies gait difficulty, no bowel and bladder incontinence.   She has gone through physical therapy, chiropractor, epidural injection without significant improvement   She had MRI of cervical spine at orthopedic clinic in August 2016, multilevel cervical degenerative changes, most severe at C5-6, with mild-to-moderate canal stenosis, no cord signal changes, was not able to reviewed the film  UPDATE March 28th 2017: We have personally reviewed MRI of cervical spine August 2016: Moderate multilevel cervical disc degeneration with moderate<BR>spinal stenosis at C5-6.  Mild spinal stenosis at C6-7 3. Mild-to-moderate multilevel foraminal stenosis as above.  Potential side effect of xeomin was explained to the patient, this is her first EMG guided xeomin injection  UPDATE June 29th 2017: She responded to her initial xeomin injection in March 28 very well, we used 200 units of xeomin, no  significant side effect, took about 2 weeks to take effect, the benefit lasts about 10 weeks, in the past 2 weeks, she noticed recurrent symptoms, neck pulling to her right side   UPDATE Oct 5th 2017: She received 200 units of xeomin had previous injection on September 27 2015, but she got flulike illness shortly afterwards, she did notice worsening right-sided neck pain, pulling of her neck at the end of 3 months.  UPDATE June 11 2016: She responded very well to previous 300 units xeomin injection in October 2017, denies significant side effect, but was not able to keep every 3 months injection due to high co-pay/medication cause.  UPDATE Jan 14 2017: She responded very well to previous injection in March, does has transient swallowing difficulty, change of voice,  UPDATE October 28 2017: She responded very well to previous injection, we only used xeomin 200 units  UPDATE Feb 10 2018: She responded very well to previous Xeomin injection, there was no significant side effect noticed.  UPDATE May 12 2018: She did well with previous injection, no significant side effect noted.  UPDATE Aug 18 2018: She did well with previous injection, there was no significant side effect noted, we used xeomin 100 units  UPDATE Sept 14 2020: She did well with previous injection, she was recently treated with steroid for polymyalgia rheumatica, right temporal artery biopsy was negative, also suffered elevated glucose, right foot stress fracture without injury  UPDATE Mar 17 2019: She did well with previous injection in September 2020  UPDATE June 22 2019: She responded very well to previous injection  UPDATE October 05 2019: She tolerated  injection well, did help her neck pain, range of motion, no significant side effect noted.  ASSESSMENT AND PLAN  Cervical dystonia:  She has mild to moderate anterocollis, mild right tilt, mild left shoulder elevation.  Her first EMG guided xeomin injection in March 28th  2017  Used Xeomin 200units/2cc NS) under EMG guidance     Right levator scapula 25 units Left levator scapula 25 units Right longissimus capitis 25 units Right splenius capitis 25 Right iliocostalis 25 units Right splenius cervix 25 units Left splenius cervix 25 units Left splenius capitis 25 units    She tolerated the injection well,   Return in 3 months for repeat injections   Cassandra Decker, M.D. Ph.D.  [facility] [address] [place], [place] [address] Ph: [phone] Fax: [phone]   CC:Cassandra Apple, MD. Dr. Nicholos Decker

## 2019-10-05 NOTE — Telephone Encounter (Signed)
Noted  

## 2019-10-05 NOTE — Telephone Encounter (Signed)
noted 

## 2019-10-07 DIAGNOSIS — M7741 Metatarsalgia, right foot: Secondary | ICD-10-CM | POA: Diagnosis not present

## 2019-10-07 DIAGNOSIS — G5761 Lesion of plantar nerve, right lower limb: Secondary | ICD-10-CM | POA: Diagnosis not present

## 2019-10-25 DIAGNOSIS — E039 Hypothyroidism, unspecified: Secondary | ICD-10-CM | POA: Diagnosis not present

## 2019-10-25 DIAGNOSIS — R519 Headache, unspecified: Secondary | ICD-10-CM | POA: Diagnosis not present

## 2019-10-25 DIAGNOSIS — Z79899 Other long term (current) drug therapy: Secondary | ICD-10-CM | POA: Diagnosis not present

## 2019-10-25 DIAGNOSIS — Z6827 Body mass index (BMI) 27.0-27.9, adult: Secondary | ICD-10-CM | POA: Diagnosis not present

## 2019-10-25 DIAGNOSIS — I10 Essential (primary) hypertension: Secondary | ICD-10-CM | POA: Diagnosis not present

## 2019-10-25 DIAGNOSIS — E559 Vitamin D deficiency, unspecified: Secondary | ICD-10-CM | POA: Diagnosis not present

## 2019-10-25 DIAGNOSIS — M353 Polymyalgia rheumatica: Secondary | ICD-10-CM | POA: Diagnosis not present

## 2019-10-25 DIAGNOSIS — E119 Type 2 diabetes mellitus without complications: Secondary | ICD-10-CM | POA: Diagnosis not present

## 2019-10-25 DIAGNOSIS — M545 Low back pain: Secondary | ICD-10-CM | POA: Diagnosis not present

## 2019-10-25 DIAGNOSIS — E663 Overweight: Secondary | ICD-10-CM | POA: Diagnosis not present

## 2019-11-21 DIAGNOSIS — Z6827 Body mass index (BMI) 27.0-27.9, adult: Secondary | ICD-10-CM | POA: Diagnosis not present

## 2019-11-21 DIAGNOSIS — M545 Low back pain: Secondary | ICD-10-CM | POA: Diagnosis not present

## 2019-11-21 DIAGNOSIS — I10 Essential (primary) hypertension: Secondary | ICD-10-CM | POA: Diagnosis not present

## 2019-11-21 DIAGNOSIS — E559 Vitamin D deficiency, unspecified: Secondary | ICD-10-CM | POA: Diagnosis not present

## 2019-11-21 DIAGNOSIS — E039 Hypothyroidism, unspecified: Secondary | ICD-10-CM | POA: Diagnosis not present

## 2019-11-21 DIAGNOSIS — E119 Type 2 diabetes mellitus without complications: Secondary | ICD-10-CM | POA: Diagnosis not present

## 2019-11-21 DIAGNOSIS — M79671 Pain in right foot: Secondary | ICD-10-CM | POA: Diagnosis not present

## 2019-11-21 DIAGNOSIS — E663 Overweight: Secondary | ICD-10-CM | POA: Diagnosis not present

## 2019-11-21 DIAGNOSIS — R519 Headache, unspecified: Secondary | ICD-10-CM | POA: Diagnosis not present

## 2019-11-21 DIAGNOSIS — F330 Major depressive disorder, recurrent, mild: Secondary | ICD-10-CM | POA: Diagnosis not present

## 2019-11-21 DIAGNOSIS — M353 Polymyalgia rheumatica: Secondary | ICD-10-CM | POA: Diagnosis not present

## 2020-01-11 ENCOUNTER — Ambulatory Visit: Payer: PPO | Admitting: Neurology

## 2020-01-11 ENCOUNTER — Encounter: Payer: Self-pay | Admitting: Neurology

## 2020-01-11 VITALS — BP 126/73 | HR 88 | Ht 64.0 in | Wt 145.0 lb

## 2020-01-11 DIAGNOSIS — G243 Spasmodic torticollis: Secondary | ICD-10-CM

## 2020-01-11 NOTE — Progress Notes (Signed)
PATIENT: Cassandra Decker DOB: December 17, 1945  Chief Complaint  Patient presents with  . Cervical Dystonia    Xeomin 100 units x 1 vials - specialty pharmacy     HISTORICAL  Cassandra Decker is a 74 years old right-handed female, alone at today's clinical visit, seen in refer by  Laroy Apple, her primary care Dr. Renelda Mom for evaluation of neck pain, abnormal neck posture  She had a history of hyperlipidemia, polymyalgia rheumatica, has been treated with prednisone 5 mg daily for few years, borderline diabetes, she works as Glass blower/designer for [employer]  Around 2010, she began to noticed neck discomfort, she tends to hold her neck to the right side, gradually getting worse, to the point of difficulty keeping her neck straight, tendency of head titubation, constant posterior neck pain, difficulty turning her neck to the left side, she also developed bilateral shoulder pain. She has difficulty driving, difficulty turning.  She has to hold her chin frequently to keep her neck in a straight position, she denies gait difficulty, no bowel and bladder incontinence.   She has gone through physical therapy, chiropractor, epidural injection without significant improvement   She had MRI of cervical spine at orthopedic clinic in August 2016, multilevel cervical degenerative changes, most severe at C5-6, with mild-to-moderate canal stenosis, no cord signal changes, was not able to reviewed the film  UPDATE March 28th 2017: We have personally reviewed MRI of cervical spine August 2016: Moderate multilevel cervical disc degeneration with moderate<BR>spinal stenosis at C5-6.  Mild spinal stenosis at C6-7 3. Mild-to-moderate multilevel foraminal stenosis as above.  Potential side effect of xeomin was explained to the patient, this is her first EMG guided xeomin injection  UPDATE June 29th 2017: She responded to her initial xeomin injection in March 28 very well, we used 200 units of xeomin, no  significant side effect, took about 2 weeks to take effect, the benefit lasts about 10 weeks, in the past 2 weeks, she noticed recurrent symptoms, neck pulling to her right side   UPDATE Oct 5th 2017: She received 200 units of xeomin had previous injection on September 27 2015, but she got flulike illness shortly afterwards, she did notice worsening right-sided neck pain, pulling of her neck at the end of 3 months.  UPDATE June 11 2016: She responded very well to previous 300 units xeomin injection in October 2017, denies significant side effect, but was not able to keep every 3 months injection due to high co-pay/medication cause.  UPDATE Jan 14 2017: She responded very well to previous injection in March, does has transient swallowing difficulty, change of voice,  UPDATE October 28 2017: She responded very well to previous injection, we only used xeomin 200 units  UPDATE Feb 10 2018: She responded very well to previous Xeomin injection, there was no significant side effect noticed.  UPDATE May 12 2018: She did well with previous injection, no significant side effect noted.  UPDATE Aug 18 2018: She did well with previous injection, there was no significant side effect noted, we used xeomin 100 units  UPDATE Sept 14 2020: She did well with previous injection, she was recently treated with steroid for polymyalgia rheumatica, right temporal artery biopsy was negative, also suffered elevated glucose, right foot stress fracture without injury  UPDATE Mar 17 2019: She did well with previous injection in September 2020  UPDATE June 22 2019: She responded very well to previous injection  UPDATE October 05 2019: She tolerated  injection well, did help her neck pain, range of motion, no significant side effect noted.  UPDATE Jan 11 2020: She complains of posterior neck muscle pain, achiness  ASSESSMENT AND PLAN  Cervical dystonia:  She has mild to moderate anterocollis, mild right tilt, mild  left shoulder elevation.  Her first EMG guided xeomin injection in March 28th 2017  Used Xeomin 100units/2cc NS) under EMG guidance    Right longissimus capitis 25 units Right splenius capitis 25 Right iliocostalis 12.5 units Right splenius cervix 12.5 units Left splenius cervix 12.5 units Left splenius capitis 12.5 units    She tolerated the injection well,   Return in 3 months for repeat injections   Marcial Pacas, M.D. Ph.D.  [facility] [address] [place], [place] [address] Ph: [phone] Fax: [phone]   CC:Laroy Apple, MD. Dr. Nicholos Johns

## 2020-01-11 NOTE — Progress Notes (Signed)
**  Xeomin 100 units x 1 vials, NDC 7408-1448-18, Lot [id number], Exp 02/2021, specialty pharmacy.//mck,rn**

## 2020-02-22 DIAGNOSIS — M545 Low back pain, unspecified: Secondary | ICD-10-CM | POA: Diagnosis not present

## 2020-02-22 DIAGNOSIS — E039 Hypothyroidism, unspecified: Secondary | ICD-10-CM | POA: Diagnosis not present

## 2020-02-22 DIAGNOSIS — Z6826 Body mass index (BMI) 26.0-26.9, adult: Secondary | ICD-10-CM | POA: Diagnosis not present

## 2020-02-22 DIAGNOSIS — I10 Essential (primary) hypertension: Secondary | ICD-10-CM | POA: Diagnosis not present

## 2020-02-22 DIAGNOSIS — E663 Overweight: Secondary | ICD-10-CM | POA: Diagnosis not present

## 2020-02-22 DIAGNOSIS — E119 Type 2 diabetes mellitus without complications: Secondary | ICD-10-CM | POA: Diagnosis not present

## 2020-02-22 DIAGNOSIS — F330 Major depressive disorder, recurrent, mild: Secondary | ICD-10-CM | POA: Diagnosis not present

## 2020-02-22 DIAGNOSIS — M353 Polymyalgia rheumatica: Secondary | ICD-10-CM | POA: Diagnosis not present

## 2020-02-22 DIAGNOSIS — Z6827 Body mass index (BMI) 27.0-27.9, adult: Secondary | ICD-10-CM | POA: Diagnosis not present

## 2020-02-22 DIAGNOSIS — E559 Vitamin D deficiency, unspecified: Secondary | ICD-10-CM | POA: Diagnosis not present

## 2020-02-22 DIAGNOSIS — M79671 Pain in right foot: Secondary | ICD-10-CM | POA: Diagnosis not present

## 2020-02-22 DIAGNOSIS — R519 Headache, unspecified: Secondary | ICD-10-CM | POA: Diagnosis not present

## 2020-03-05 DIAGNOSIS — M2011 Hallux valgus (acquired), right foot: Secondary | ICD-10-CM | POA: Diagnosis not present

## 2020-03-05 DIAGNOSIS — M19071 Primary osteoarthritis, right ankle and foot: Secondary | ICD-10-CM | POA: Diagnosis not present

## 2020-03-08 ENCOUNTER — Other Ambulatory Visit: Payer: Self-pay | Admitting: Orthopaedic Surgery

## 2020-03-08 DIAGNOSIS — M79671 Pain in right foot: Secondary | ICD-10-CM

## 2020-03-09 ENCOUNTER — Ambulatory Visit
Admission: RE | Admit: 2020-03-09 | Discharge: 2020-03-09 | Disposition: A | Discharge status: Home / Self Care | Payer: PPO | Source: Ambulatory Visit | Attending: Orthopaedic Surgery | Admitting: Orthopaedic Surgery

## 2020-03-09 ENCOUNTER — Other Ambulatory Visit: Payer: Self-pay

## 2020-03-09 DIAGNOSIS — M2011 Hallux valgus (acquired), right foot: Secondary | ICD-10-CM | POA: Diagnosis not present

## 2020-03-09 DIAGNOSIS — M79671 Pain in right foot: Secondary | ICD-10-CM

## 2020-03-09 DIAGNOSIS — M19071 Primary osteoarthritis, right ankle and foot: Secondary | ICD-10-CM | POA: Diagnosis not present

## 2020-03-28 DIAGNOSIS — M19071 Primary osteoarthritis, right ankle and foot: Secondary | ICD-10-CM | POA: Diagnosis not present

## 2020-04-25 ENCOUNTER — Ambulatory Visit: Payer: PPO | Admitting: Neurology

## 2020-04-25 ENCOUNTER — Other Ambulatory Visit: Payer: Self-pay

## 2020-04-25 ENCOUNTER — Encounter: Payer: Self-pay | Admitting: Neurology

## 2020-04-25 VITALS — BP 133/77 | HR 86 | Ht 64.0 in | Wt 154.0 lb

## 2020-04-25 DIAGNOSIS — G243 Spasmodic torticollis: Secondary | ICD-10-CM | POA: Diagnosis not present

## 2020-04-25 NOTE — Progress Notes (Signed)
**  Xeomin 100 units x 1 vial, NDC 4917-9150-56, Lot [id number], Exp 02/2021, specialty pharmacy.//mck,rn**

## 2020-04-25 NOTE — Progress Notes (Signed)
PATIENT: Cassandra Decker DOB: December 17, 1945  Chief Complaint  Patient presents with  . Cervical Dystonia    Xeomin 100 units x 1 vials - specialty pharmacy     HISTORICAL  Cassandra Decker is a 75 years old right-handed female, alone at today's clinical visit, seen in refer by  Laroy Apple, her primary care Dr. Renelda Mom for evaluation of neck pain, abnormal neck posture  She had a history of hyperlipidemia, polymyalgia rheumatica, has been treated with prednisone 5 mg daily for few years, borderline diabetes, she works as Glass blower/designer for [employer]  Around 2010, she began to noticed neck discomfort, she tends to hold her neck to the right side, gradually getting worse, to the point of difficulty keeping her neck straight, tendency of head titubation, constant posterior neck pain, difficulty turning her neck to the left side, she also developed bilateral shoulder pain. She has difficulty driving, difficulty turning.  She has to hold her chin frequently to keep her neck in a straight position, she denies gait difficulty, no bowel and bladder incontinence.   She has gone through physical therapy, chiropractor, epidural injection without significant improvement   She had MRI of cervical spine at orthopedic clinic in August 2016, multilevel cervical degenerative changes, most severe at C5-6, with mild-to-moderate canal stenosis, no cord signal changes, was not able to reviewed the film  UPDATE March 28th 2017: We have personally reviewed MRI of cervical spine August 2016: Moderate multilevel cervical disc degeneration with moderate<BR>spinal stenosis at C5-6.  Mild spinal stenosis at C6-7 3. Mild-to-moderate multilevel foraminal stenosis as above.  Potential side effect of xeomin was explained to the patient, this is her first EMG guided xeomin injection  UPDATE June 29th 2017: She responded to her initial xeomin injection in March 28 very well, we used 200 units of xeomin, no  significant side effect, took about 2 weeks to take effect, the benefit lasts about 10 weeks, in the past 2 weeks, she noticed recurrent symptoms, neck pulling to her right side   UPDATE Oct 5th 2017: She received 200 units of xeomin had previous injection on September 27 2015, but she got flulike illness shortly afterwards, she did notice worsening right-sided neck pain, pulling of her neck at the end of 3 months.  UPDATE June 11 2016: She responded very well to previous 300 units xeomin injection in October 2017, denies significant side effect, but was not able to keep every 3 months injection due to high co-pay/medication cause.  UPDATE Jan 14 2017: She responded very well to previous injection in March, does has transient swallowing difficulty, change of voice,  UPDATE October 28 2017: She responded very well to previous injection, we only used xeomin 200 units  UPDATE Feb 10 2018: She responded very well to previous Xeomin injection, there was no significant side effect noticed.  UPDATE May 12 2018: She did well with previous injection, no significant side effect noted.  UPDATE Aug 18 2018: She did well with previous injection, there was no significant side effect noted, we used xeomin 100 units  UPDATE Sept 14 2020: She did well with previous injection, she was recently treated with steroid for polymyalgia rheumatica, right temporal artery biopsy was negative, also suffered elevated glucose, right foot stress fracture without injury  UPDATE Mar 17 2019: She did well with previous injection in September 2020  UPDATE June 22 2019: She responded very well to previous injection  UPDATE October 05 2019: She tolerated  injection well, did help her neck pain, range of motion, no significant side effect noted.  UPDATE Jan 11 2020: She complains of posterior neck muscle pain, achiness  Update April 25, 2020 She complains of right shoulder area pain, responding well to previous  injection  ASSESSMENT AND PLAN  Cervical dystonia:  She has mild to moderate anterocollis, mild right tilt, mild left shoulder elevation.  Her first EMG guided xeomin injection in March 28th 2017  Used Xeomin 100units/2cc NS) under EMG guidance    Right longissimus capitis 12.5 units Right levator scapula 12.5 units Right splenius capitis 12.5 Right iliocostalis 12.5 units Right splenius cervix 12.5 units  Left levator scapular 12.5 Left iliocostalis 12.5 Left splenius capitis 12.5 units    She tolerated the injection well,   Return in 3 months for repeat injections   Marcial Pacas, M.D. Ph.D.  [facility] [address], [place], [place] [address] Ph: [phone] Fax: [phone]   CC:Laroy Apple, MD. Dr. Nicholos Johns

## 2020-04-30 DIAGNOSIS — H524 Presbyopia: Secondary | ICD-10-CM | POA: Diagnosis not present

## 2020-05-02 DIAGNOSIS — S29011A Strain of muscle and tendon of front wall of thorax, initial encounter: Secondary | ICD-10-CM | POA: Diagnosis not present

## 2020-05-09 DIAGNOSIS — M25511 Pain in right shoulder: Secondary | ICD-10-CM | POA: Diagnosis not present

## 2020-05-09 DIAGNOSIS — D481 Neoplasm of uncertain behavior of connective and other soft tissue: Secondary | ICD-10-CM | POA: Diagnosis not present

## 2020-05-09 DIAGNOSIS — R079 Chest pain, unspecified: Secondary | ICD-10-CM | POA: Diagnosis not present

## 2020-05-09 DIAGNOSIS — D480 Neoplasm of uncertain behavior of bone and articular cartilage: Secondary | ICD-10-CM | POA: Diagnosis not present

## 2020-05-09 DIAGNOSIS — R0789 Other chest pain: Secondary | ICD-10-CM | POA: Diagnosis not present

## 2020-05-14 DIAGNOSIS — M19011 Primary osteoarthritis, right shoulder: Secondary | ICD-10-CM | POA: Diagnosis not present

## 2020-05-14 DIAGNOSIS — M24811 Other specific joint derangements of right shoulder, not elsewhere classified: Secondary | ICD-10-CM | POA: Diagnosis not present

## 2020-05-15 DIAGNOSIS — M25511 Pain in right shoulder: Secondary | ICD-10-CM | POA: Diagnosis not present

## 2020-05-15 DIAGNOSIS — I10 Essential (primary) hypertension: Secondary | ICD-10-CM | POA: Diagnosis not present

## 2020-05-15 DIAGNOSIS — M353 Polymyalgia rheumatica: Secondary | ICD-10-CM | POA: Diagnosis not present

## 2020-05-15 DIAGNOSIS — Z1231 Encounter for screening mammogram for malignant neoplasm of breast: Secondary | ICD-10-CM | POA: Diagnosis not present

## 2020-05-15 DIAGNOSIS — N644 Mastodynia: Secondary | ICD-10-CM | POA: Diagnosis not present

## 2020-05-18 ENCOUNTER — Other Ambulatory Visit: Payer: Self-pay | Admitting: Orthopedic Surgery

## 2020-05-18 DIAGNOSIS — M4692 Unspecified inflammatory spondylopathy, cervical region: Secondary | ICD-10-CM

## 2020-05-18 DIAGNOSIS — M4722 Other spondylosis with radiculopathy, cervical region: Secondary | ICD-10-CM

## 2020-05-22 ENCOUNTER — Ambulatory Visit
Admission: RE | Admit: 2020-05-22 | Discharge: 2020-05-22 | Disposition: A | Discharge status: Home / Self Care | Payer: PPO | Source: Ambulatory Visit | Attending: Orthopedic Surgery | Admitting: Orthopedic Surgery

## 2020-05-22 DIAGNOSIS — M5021 Other cervical disc displacement,  high cervical region: Secondary | ICD-10-CM | POA: Diagnosis not present

## 2020-05-22 DIAGNOSIS — M4692 Unspecified inflammatory spondylopathy, cervical region: Secondary | ICD-10-CM

## 2020-05-22 DIAGNOSIS — M4722 Other spondylosis with radiculopathy, cervical region: Secondary | ICD-10-CM

## 2020-05-22 DIAGNOSIS — M5023 Other cervical disc displacement, cervicothoracic region: Secondary | ICD-10-CM | POA: Diagnosis not present

## 2020-05-22 DIAGNOSIS — M4802 Spinal stenosis, cervical region: Secondary | ICD-10-CM | POA: Diagnosis not present

## 2020-05-22 DIAGNOSIS — R531 Weakness: Secondary | ICD-10-CM | POA: Diagnosis not present

## 2020-05-28 DIAGNOSIS — M4722 Other spondylosis with radiculopathy, cervical region: Secondary | ICD-10-CM | POA: Diagnosis not present

## 2020-06-06 DIAGNOSIS — M79671 Pain in right foot: Secondary | ICD-10-CM | POA: Diagnosis not present

## 2020-06-06 DIAGNOSIS — M545 Low back pain, unspecified: Secondary | ICD-10-CM | POA: Diagnosis not present

## 2020-06-06 DIAGNOSIS — E119 Type 2 diabetes mellitus without complications: Secondary | ICD-10-CM | POA: Diagnosis not present

## 2020-06-06 DIAGNOSIS — M810 Age-related osteoporosis without current pathological fracture: Secondary | ICD-10-CM | POA: Diagnosis not present

## 2020-06-06 DIAGNOSIS — I10 Essential (primary) hypertension: Secondary | ICD-10-CM | POA: Diagnosis not present

## 2020-06-06 DIAGNOSIS — Z79899 Other long term (current) drug therapy: Secondary | ICD-10-CM | POA: Diagnosis not present

## 2020-06-06 DIAGNOSIS — E663 Overweight: Secondary | ICD-10-CM | POA: Diagnosis not present

## 2020-06-06 DIAGNOSIS — F330 Major depressive disorder, recurrent, mild: Secondary | ICD-10-CM | POA: Diagnosis not present

## 2020-06-06 DIAGNOSIS — M353 Polymyalgia rheumatica: Secondary | ICD-10-CM | POA: Diagnosis not present

## 2020-06-06 DIAGNOSIS — R519 Headache, unspecified: Secondary | ICD-10-CM | POA: Diagnosis not present

## 2020-06-06 DIAGNOSIS — E559 Vitamin D deficiency, unspecified: Secondary | ICD-10-CM | POA: Diagnosis not present

## 2020-06-06 DIAGNOSIS — E039 Hypothyroidism, unspecified: Secondary | ICD-10-CM | POA: Diagnosis not present

## 2020-06-08 DIAGNOSIS — N644 Mastodynia: Secondary | ICD-10-CM | POA: Diagnosis not present

## 2020-06-08 DIAGNOSIS — M5412 Radiculopathy, cervical region: Secondary | ICD-10-CM | POA: Diagnosis not present

## 2020-06-08 DIAGNOSIS — R928 Other abnormal and inconclusive findings on diagnostic imaging of breast: Secondary | ICD-10-CM | POA: Diagnosis not present

## 2020-06-08 DIAGNOSIS — R922 Inconclusive mammogram: Secondary | ICD-10-CM | POA: Diagnosis not present

## 2020-06-09 ENCOUNTER — Other Ambulatory Visit: Payer: PPO

## 2020-06-18 DIAGNOSIS — M50123 Cervical disc disorder at C6-C7 level with radiculopathy: Secondary | ICD-10-CM | POA: Diagnosis not present

## 2020-06-18 DIAGNOSIS — M542 Cervicalgia: Secondary | ICD-10-CM | POA: Diagnosis not present

## 2020-06-19 DIAGNOSIS — R200 Anesthesia of skin: Secondary | ICD-10-CM | POA: Diagnosis not present

## 2020-06-19 DIAGNOSIS — Z79899 Other long term (current) drug therapy: Secondary | ICD-10-CM | POA: Diagnosis not present

## 2020-06-19 DIAGNOSIS — E039 Hypothyroidism, unspecified: Secondary | ICD-10-CM | POA: Diagnosis not present

## 2020-06-19 DIAGNOSIS — E559 Vitamin D deficiency, unspecified: Secondary | ICD-10-CM | POA: Diagnosis not present

## 2020-06-19 DIAGNOSIS — E119 Type 2 diabetes mellitus without complications: Secondary | ICD-10-CM | POA: Diagnosis not present

## 2020-06-21 DIAGNOSIS — M50123 Cervical disc disorder at C6-C7 level with radiculopathy: Secondary | ICD-10-CM | POA: Diagnosis not present

## 2020-06-21 DIAGNOSIS — M542 Cervicalgia: Secondary | ICD-10-CM | POA: Diagnosis not present

## 2020-06-26 DIAGNOSIS — M542 Cervicalgia: Secondary | ICD-10-CM | POA: Diagnosis not present

## 2020-06-26 DIAGNOSIS — M50123 Cervical disc disorder at C6-C7 level with radiculopathy: Secondary | ICD-10-CM | POA: Diagnosis not present

## 2020-07-03 DIAGNOSIS — M542 Cervicalgia: Secondary | ICD-10-CM | POA: Diagnosis not present

## 2020-07-03 DIAGNOSIS — M50123 Cervical disc disorder at C6-C7 level with radiculopathy: Secondary | ICD-10-CM | POA: Diagnosis not present

## 2020-07-04 DIAGNOSIS — G5601 Carpal tunnel syndrome, right upper limb: Secondary | ICD-10-CM | POA: Diagnosis not present

## 2020-07-10 DIAGNOSIS — M542 Cervicalgia: Secondary | ICD-10-CM | POA: Diagnosis not present

## 2020-07-10 DIAGNOSIS — M50123 Cervical disc disorder at C6-C7 level with radiculopathy: Secondary | ICD-10-CM | POA: Diagnosis not present

## 2020-07-17 DIAGNOSIS — M5412 Radiculopathy, cervical region: Secondary | ICD-10-CM | POA: Diagnosis not present

## 2020-07-18 DIAGNOSIS — M542 Cervicalgia: Secondary | ICD-10-CM | POA: Diagnosis not present

## 2020-07-18 DIAGNOSIS — M50123 Cervical disc disorder at C6-C7 level with radiculopathy: Secondary | ICD-10-CM | POA: Diagnosis not present

## 2020-07-25 ENCOUNTER — Ambulatory Visit: Payer: PPO | Admitting: Neurology

## 2020-08-07 DIAGNOSIS — M542 Cervicalgia: Secondary | ICD-10-CM | POA: Diagnosis not present

## 2020-08-07 DIAGNOSIS — M50123 Cervical disc disorder at C6-C7 level with radiculopathy: Secondary | ICD-10-CM | POA: Diagnosis not present

## 2020-08-13 ENCOUNTER — Ambulatory Visit: Payer: PPO | Admitting: Neurology

## 2020-08-13 ENCOUNTER — Encounter: Payer: Self-pay | Admitting: Neurology

## 2020-08-13 VITALS — BP 146/83 | HR 91 | Ht 64.0 in | Wt 149.5 lb

## 2020-08-13 DIAGNOSIS — G243 Spasmodic torticollis: Secondary | ICD-10-CM

## 2020-08-13 NOTE — Progress Notes (Signed)
**  Xeomin 100 units x 1 vial, [facility] [phone], Lot [id number], Exp 02/2021, specialty pharmacy.//mck,rn**

## 2020-08-13 NOTE — Progress Notes (Signed)
PATIENT: Cassandra Decker DOB: December 17, 1945  Chief Complaint  Patient presents with  . Cervical Dystonia    Xeomin 100 units x 1 vials - specialty pharmacy     HISTORICAL  Cassandra Decker is a 75 years old right-handed female, alone at today's clinical visit, seen in refer by  Laroy Apple, her primary care Dr. Renelda Mom for evaluation of neck pain, abnormal neck posture  She had a history of hyperlipidemia, polymyalgia rheumatica, has been treated with prednisone 5 mg daily for few years, borderline diabetes, she works as Glass blower/designer for [employer]  Around 2010, she began to noticed neck discomfort, she tends to hold her neck to the right side, gradually getting worse, to the point of difficulty keeping her neck straight, tendency of head titubation, constant posterior neck pain, difficulty turning her neck to the left side, she also developed bilateral shoulder pain. She has difficulty driving, difficulty turning.  She has to hold her chin frequently to keep her neck in a straight position, she denies gait difficulty, no bowel and bladder incontinence.   She has gone through physical therapy, chiropractor, epidural injection without significant improvement   She had MRI of cervical spine at orthopedic clinic in August 2016, multilevel cervical degenerative changes, most severe at C5-6, with mild-to-moderate canal stenosis, no cord signal changes, was not able to reviewed the film  UPDATE March 28th 2017: We have personally reviewed MRI of cervical spine August 2016: Moderate multilevel cervical disc degeneration with moderate<BR>spinal stenosis at C5-6.  Mild spinal stenosis at C6-7 3. Mild-to-moderate multilevel foraminal stenosis as above.  Potential side effect of xeomin was explained to the patient, this is her first EMG guided xeomin injection  UPDATE June 29th 2017: She responded to her initial xeomin injection in March 28 very well, we used 200 units of xeomin, no  significant side effect, took about 2 weeks to take effect, the benefit lasts about 10 weeks, in the past 2 weeks, she noticed recurrent symptoms, neck pulling to her right side   UPDATE Oct 5th 2017: She received 200 units of xeomin had previous injection on September 27 2015, but she got flulike illness shortly afterwards, she did notice worsening right-sided neck pain, pulling of her neck at the end of 3 months.  UPDATE June 11 2016: She responded very well to previous 300 units xeomin injection in October 2017, denies significant side effect, but was not able to keep every 3 months injection due to high co-pay/medication cause.  UPDATE Jan 14 2017: She responded very well to previous injection in March, does has transient swallowing difficulty, change of voice,  UPDATE October 28 2017: She responded very well to previous injection, we only used xeomin 200 units  UPDATE Feb 10 2018: She responded very well to previous Xeomin injection, there was no significant side effect noticed.  UPDATE May 12 2018: She did well with previous injection, no significant side effect noted.  UPDATE Aug 18 2018: She did well with previous injection, there was no significant side effect noted, we used xeomin 100 units  UPDATE Sept 14 2020: She did well with previous injection, she was recently treated with steroid for polymyalgia rheumatica, right temporal artery biopsy was negative, also suffered elevated glucose, right foot stress fracture without injury  UPDATE Mar 17 2019: She did well with previous injection in September 2020  UPDATE June 22 2019: She responded very well to previous injection  UPDATE October 05 2019: She tolerated  injection well, did help her neck pain, range of motion, no significant side effect noted.  UPDATE Jan 11 2020: She complains of posterior neck muscle pain, achiness  Update April 25, 2020 She complains of right shoulder area pain, responding well to previous  injection  Update Aug 13, 2020: She responded well to previous injection, did not help her neck pain,  ASSESSMENT AND PLAN  Cervical dystonia:  She has mild to moderate anterocollis, mild right tilt, mild left shoulder elevation.  Her first EMG guided xeomin injection in March 28th 2017  Used Xeomin 100units/2cc NS) under EMG guidance    Right longissimus capitis 12.5 units Right upper trapezius 12.5 units Right splenius capitis 12.5 Right iliocostalis 12.5 units Right splenius cervix 12.5 units  Left levator scapular 12.5 Left iliocostalis 12.5 Left splenius capitis 12.5 units    She tolerated the injection well,   Return in 3 months for repeat injections   Marcial Pacas, M.D. Ph.D.  [facility] [address], [place], [place] [address] Ph: [phone] Fax: [phone]   CC:Laroy Apple, MD. Dr. Nicholos Johns

## 2020-08-14 ENCOUNTER — Ambulatory Visit: Payer: PPO | Admitting: Neurology

## 2020-08-14 DIAGNOSIS — M542 Cervicalgia: Secondary | ICD-10-CM | POA: Diagnosis not present

## 2020-08-14 DIAGNOSIS — M50123 Cervical disc disorder at C6-C7 level with radiculopathy: Secondary | ICD-10-CM | POA: Diagnosis not present

## 2020-08-21 DIAGNOSIS — M542 Cervicalgia: Secondary | ICD-10-CM | POA: Diagnosis not present

## 2020-08-21 DIAGNOSIS — M50123 Cervical disc disorder at C6-C7 level with radiculopathy: Secondary | ICD-10-CM | POA: Diagnosis not present

## 2020-08-29 DIAGNOSIS — M545 Low back pain, unspecified: Secondary | ICD-10-CM | POA: Diagnosis not present

## 2020-08-29 DIAGNOSIS — R519 Headache, unspecified: Secondary | ICD-10-CM | POA: Diagnosis not present

## 2020-08-29 DIAGNOSIS — E663 Overweight: Secondary | ICD-10-CM | POA: Diagnosis not present

## 2020-08-29 DIAGNOSIS — E559 Vitamin D deficiency, unspecified: Secondary | ICD-10-CM | POA: Diagnosis not present

## 2020-08-29 DIAGNOSIS — M353 Polymyalgia rheumatica: Secondary | ICD-10-CM | POA: Diagnosis not present

## 2020-08-29 DIAGNOSIS — E039 Hypothyroidism, unspecified: Secondary | ICD-10-CM | POA: Diagnosis not present

## 2020-08-29 DIAGNOSIS — I10 Essential (primary) hypertension: Secondary | ICD-10-CM | POA: Diagnosis not present

## 2020-08-29 DIAGNOSIS — F330 Major depressive disorder, recurrent, mild: Secondary | ICD-10-CM | POA: Diagnosis not present

## 2020-08-29 DIAGNOSIS — Z79899 Other long term (current) drug therapy: Secondary | ICD-10-CM | POA: Diagnosis not present

## 2020-08-29 DIAGNOSIS — E119 Type 2 diabetes mellitus without complications: Secondary | ICD-10-CM | POA: Diagnosis not present

## 2020-08-29 DIAGNOSIS — M79671 Pain in right foot: Secondary | ICD-10-CM | POA: Diagnosis not present

## 2020-08-29 DIAGNOSIS — M810 Age-related osteoporosis without current pathological fracture: Secondary | ICD-10-CM | POA: Diagnosis not present

## 2020-09-03 DIAGNOSIS — E039 Hypothyroidism, unspecified: Secondary | ICD-10-CM | POA: Diagnosis not present

## 2020-09-03 DIAGNOSIS — E119 Type 2 diabetes mellitus without complications: Secondary | ICD-10-CM | POA: Diagnosis not present

## 2020-11-14 ENCOUNTER — Ambulatory Visit: Payer: PPO | Admitting: Neurology

## 2020-12-05 DIAGNOSIS — E119 Type 2 diabetes mellitus without complications: Secondary | ICD-10-CM | POA: Diagnosis not present

## 2020-12-05 DIAGNOSIS — M353 Polymyalgia rheumatica: Secondary | ICD-10-CM | POA: Diagnosis not present

## 2020-12-05 DIAGNOSIS — E559 Vitamin D deficiency, unspecified: Secondary | ICD-10-CM | POA: Diagnosis not present

## 2020-12-05 DIAGNOSIS — E663 Overweight: Secondary | ICD-10-CM | POA: Diagnosis not present

## 2020-12-05 DIAGNOSIS — F330 Major depressive disorder, recurrent, mild: Secondary | ICD-10-CM | POA: Diagnosis not present

## 2020-12-05 DIAGNOSIS — I10 Essential (primary) hypertension: Secondary | ICD-10-CM | POA: Diagnosis not present

## 2020-12-05 DIAGNOSIS — M810 Age-related osteoporosis without current pathological fracture: Secondary | ICD-10-CM | POA: Diagnosis not present

## 2020-12-05 DIAGNOSIS — M79671 Pain in right foot: Secondary | ICD-10-CM | POA: Diagnosis not present

## 2020-12-05 DIAGNOSIS — M545 Low back pain, unspecified: Secondary | ICD-10-CM | POA: Diagnosis not present

## 2020-12-05 DIAGNOSIS — R519 Headache, unspecified: Secondary | ICD-10-CM | POA: Diagnosis not present

## 2020-12-05 DIAGNOSIS — E039 Hypothyroidism, unspecified: Secondary | ICD-10-CM | POA: Diagnosis not present

## 2020-12-05 DIAGNOSIS — Z79899 Other long term (current) drug therapy: Secondary | ICD-10-CM | POA: Diagnosis not present

## 2021-03-04 ENCOUNTER — Telehealth: Payer: Self-pay

## 2021-03-04 NOTE — Telephone Encounter (Signed)
Zipporah Plants, We received a message from Ms. Harvell in referrals to r/s her injection. Can you please give her a call back?  Thanks!

## 2021-03-05 NOTE — Telephone Encounter (Signed)
I called [facility] and spoke with the pharmacist to update prescription (Xeomin 100 x3, patient fills 3 at a time due to cost).

## 2021-03-05 NOTE — Telephone Encounter (Signed)
Returned patient's call. She is scheduled for 1/17 at 1:00 with Dr. Krista Blue for Xeomin injections. I checked our stock, patient still has (1) 100 unit vial of Xeomin here from [facility], however it expires 02/2021. I will keep an eye on the schedule to see if any dates become available this month so we can get patient in before vial expires. I will also call and check patient's prescription with [facility].

## 2021-03-18 NOTE — Telephone Encounter (Signed)
I called [facility] to check status. They have been trying to reach patient for consent. I dialed patient in to call with pharmacy, patient gave consent for pharmacy to ship (3) 100 unit vials. Xeomin TBD 12/28.

## 2021-03-27 NOTE — Telephone Encounter (Signed)
Received (3) 100 unit vials of Xeomin today from [facility].

## 2021-04-04 DIAGNOSIS — E039 Hypothyroidism, unspecified: Secondary | ICD-10-CM | POA: Diagnosis not present

## 2021-04-04 DIAGNOSIS — M79671 Pain in right foot: Secondary | ICD-10-CM | POA: Diagnosis not present

## 2021-04-04 DIAGNOSIS — E559 Vitamin D deficiency, unspecified: Secondary | ICD-10-CM | POA: Diagnosis not present

## 2021-04-04 DIAGNOSIS — M353 Polymyalgia rheumatica: Secondary | ICD-10-CM | POA: Diagnosis not present

## 2021-04-04 DIAGNOSIS — F330 Major depressive disorder, recurrent, mild: Secondary | ICD-10-CM | POA: Diagnosis not present

## 2021-04-04 DIAGNOSIS — I10 Essential (primary) hypertension: Secondary | ICD-10-CM | POA: Diagnosis not present

## 2021-04-04 DIAGNOSIS — E119 Type 2 diabetes mellitus without complications: Secondary | ICD-10-CM | POA: Diagnosis not present

## 2021-04-04 DIAGNOSIS — Z79899 Other long term (current) drug therapy: Secondary | ICD-10-CM | POA: Diagnosis not present

## 2021-04-04 DIAGNOSIS — J309 Allergic rhinitis, unspecified: Secondary | ICD-10-CM | POA: Diagnosis not present

## 2021-04-04 DIAGNOSIS — M545 Low back pain, unspecified: Secondary | ICD-10-CM | POA: Diagnosis not present

## 2021-04-04 DIAGNOSIS — Z23 Encounter for immunization: Secondary | ICD-10-CM | POA: Diagnosis not present

## 2021-04-04 DIAGNOSIS — R519 Headache, unspecified: Secondary | ICD-10-CM | POA: Diagnosis not present

## 2021-04-16 ENCOUNTER — Encounter: Payer: Self-pay | Admitting: Neurology

## 2021-04-16 ENCOUNTER — Ambulatory Visit: Payer: PPO | Admitting: Neurology

## 2021-04-16 VITALS — BP 144/84 | HR 86 | Ht 64.0 in

## 2021-04-16 DIAGNOSIS — G243 Spasmodic torticollis: Secondary | ICD-10-CM | POA: Diagnosis not present

## 2021-04-16 NOTE — Progress Notes (Signed)
Xeomin 100 units x 1 vial, [place] [id number], Lot [id number], Exp-12/24 , specialty pharmacy.

## 2021-04-16 NOTE — Progress Notes (Signed)
PATIENT: Cassandra Decker DOB: 10-20-1945  Chief Complaint  Patient presents with   Cervical Dystonia    Xeomin 100 units x 1 vials - specialty pharmacy     HISTORICAL  Cassandra Decker is a 76 years old right-handed female, alone at today's clinical visit, seen in refer by  Laroy Apple, her primary care Dr. Renelda Mom for evaluation of neck pain, abnormal neck posture  She had a history of hyperlipidemia, polymyalgia rheumatica, has been treated with prednisone 5 mg daily for few years, borderline diabetes, she works as Glass blower/designer for [employer]  Around 2010, she began to noticed neck discomfort, she tends to hold her neck to the right side, gradually getting worse, to the point of difficulty keeping her neck straight, tendency of head titubation, constant posterior neck pain, difficulty turning her neck to the left side, she also developed bilateral shoulder pain. She has difficulty driving, difficulty turning.  She has to hold her chin frequently to keep her neck in a straight position, she denies gait difficulty, no bowel and bladder incontinence.   She has gone through physical therapy, chiropractor, epidural injection without significant improvement   She had MRI of cervical spine at orthopedic clinic in August 2016, multilevel cervical degenerative changes, most severe at C5-6, with mild-to-moderate canal stenosis, no cord signal changes, was not able to reviewed the film  UPDATE March 28th 2017: We have personally reviewed MRI of cervical spine August 2016: Moderate multilevel cervical disc degeneration with moderate<BR>spinal stenosis at C5-6.  Mild spinal stenosis at C6-7 3. Mild-to-moderate multilevel foraminal stenosis as above.  Potential side effect of xeomin was explained to the patient, this is her first EMG guided xeomin injection  UPDATE June 29th 2017: She responded to her initial xeomin injection in March 28 very well, we used 200 units of xeomin, no  significant side effect, took about 2 weeks to take effect, the benefit lasts about 10 weeks, in the past 2 weeks, she noticed recurrent symptoms, neck pulling to her right side   UPDATE Oct 5th 2017: She received 200 units of xeomin had previous injection on September 27 2015, but she got flulike illness shortly afterwards, she did notice worsening right-sided neck pain, pulling of her neck at the end of 3 months.  UPDATE June 11 2016: She responded very well to previous 300 units xeomin injection in October 2017, denies significant side effect, but was not able to keep every 3 months injection due to high co-pay/medication cause.  UPDATE Jan 14 2017: She responded very well to previous injection in March, does has transient swallowing difficulty, change of voice,  UPDATE October 28 2017: She responded very well to previous injection, we only used xeomin 200 units  UPDATE Feb 10 2018: She responded very well to previous Xeomin injection, there was no significant side effect noticed.  UPDATE May 12 2018: She did well with previous injection, no significant side effect noted.  UPDATE Aug 18 2018: She did well with previous injection, there was no significant side effect noted, we used xeomin 100 units  UPDATE Sept 14 2020: She did well with previous injection, she was recently treated with steroid for polymyalgia rheumatica, right temporal artery biopsy was negative, also suffered elevated glucose, right foot stress fracture without injury  UPDATE Mar 17 2019: She did well with previous injection in September 2020  UPDATE June 22 2019: She responded very well to previous injection  UPDATE October 05 2019: She tolerated  injection well, did help her neck pain, range of motion, no significant side effect noted.  UPDATE Jan 11 2020: She complains of posterior neck muscle pain, achiness  Update April 25, 2020 She complains of right shoulder area pain, responding well to previous  injection  Update Aug 13, 2020: She responded well to previous injection, did not help her neck pain,  Update April 16, 2021: Her husband was ill during the summer, needed intubation, she missed multiple previous appointment, overall her neck was doing well, recently noticed tension at bilateral levator scapular, tendency to turn left, occasionally titubation  ASSESSMENT AND PLAN  Cervical dystonia:  She has slight anterocollis, mild left shoulder elevation, tenderness of bilateral levator scapular at scapular insertion  Her first EMG guided xeomin injection in March 28th 2017  Used Xeomin 100units/2cc NS) under EMG guidance    Right longissimus capitis 12.5 units Right splenius capitis 12.5 Right splenius cervix 12.5 units Right levator scapula 12.5x2= 25  Left levator scapular 25 Left splenius cervix 12.5  She tolerated the injection well,   Return in 3 months for repeat injections   Marcial Pacas, M.D. Ph.D.  [facility] [address], [place], [place] [address] Ph: [phone] Fax: [phone]   CC:Laroy Apple, MD. Dr. Nicholos Johns

## 2021-04-22 DIAGNOSIS — R0982 Postnasal drip: Secondary | ICD-10-CM | POA: Diagnosis not present

## 2021-04-22 DIAGNOSIS — B029 Zoster without complications: Secondary | ICD-10-CM | POA: Diagnosis not present

## 2021-05-29 DIAGNOSIS — Z1331 Encounter for screening for depression: Secondary | ICD-10-CM | POA: Diagnosis not present

## 2021-05-29 DIAGNOSIS — Z0000 Encounter for general adult medical examination without abnormal findings: Secondary | ICD-10-CM | POA: Diagnosis not present

## 2021-05-29 DIAGNOSIS — Z9181 History of falling: Secondary | ICD-10-CM | POA: Diagnosis not present

## 2021-05-29 DIAGNOSIS — E785 Hyperlipidemia, unspecified: Secondary | ICD-10-CM | POA: Diagnosis not present

## 2021-06-18 ENCOUNTER — Telehealth: Payer: Self-pay | Admitting: Neurology

## 2021-06-18 MED ORDER — XEOMIN 100 UNITS IM SOLR: 100.0000 [IU] | INTRAMUSCULAR | 3 refills | Status: DC

## 2021-06-18 NOTE — Telephone Encounter (Signed)
Please send Xeomin Rx refill to [facility]. ?

## 2021-06-18 NOTE — Telephone Encounter (Signed)
Xeomin 100 units sent to requested pharmacy. ?

## 2021-06-26 DIAGNOSIS — M353 Polymyalgia rheumatica: Secondary | ICD-10-CM | POA: Diagnosis not present

## 2021-07-09 DIAGNOSIS — R519 Headache, unspecified: Secondary | ICD-10-CM | POA: Diagnosis not present

## 2021-07-09 DIAGNOSIS — E039 Hypothyroidism, unspecified: Secondary | ICD-10-CM | POA: Diagnosis not present

## 2021-07-09 DIAGNOSIS — Z139 Encounter for screening, unspecified: Secondary | ICD-10-CM | POA: Diagnosis not present

## 2021-07-09 DIAGNOSIS — M545 Low back pain, unspecified: Secondary | ICD-10-CM | POA: Diagnosis not present

## 2021-07-09 DIAGNOSIS — M79671 Pain in right foot: Secondary | ICD-10-CM | POA: Diagnosis not present

## 2021-07-09 DIAGNOSIS — Z79899 Other long term (current) drug therapy: Secondary | ICD-10-CM | POA: Diagnosis not present

## 2021-07-09 DIAGNOSIS — E785 Hyperlipidemia, unspecified: Secondary | ICD-10-CM | POA: Diagnosis not present

## 2021-07-09 DIAGNOSIS — E559 Vitamin D deficiency, unspecified: Secondary | ICD-10-CM | POA: Diagnosis not present

## 2021-07-09 DIAGNOSIS — G500 Trigeminal neuralgia: Secondary | ICD-10-CM | POA: Diagnosis not present

## 2021-07-09 DIAGNOSIS — M353 Polymyalgia rheumatica: Secondary | ICD-10-CM | POA: Diagnosis not present

## 2021-07-09 DIAGNOSIS — E1169 Type 2 diabetes mellitus with other specified complication: Secondary | ICD-10-CM | POA: Diagnosis not present

## 2021-07-09 DIAGNOSIS — I10 Essential (primary) hypertension: Secondary | ICD-10-CM | POA: Diagnosis not present

## 2021-07-10 ENCOUNTER — Ambulatory Visit: Payer: PPO | Admitting: Neurology

## 2021-07-10 DIAGNOSIS — Z79899 Other long term (current) drug therapy: Secondary | ICD-10-CM | POA: Diagnosis not present

## 2021-07-10 DIAGNOSIS — E1169 Type 2 diabetes mellitus with other specified complication: Secondary | ICD-10-CM | POA: Diagnosis not present

## 2021-07-10 DIAGNOSIS — E785 Hyperlipidemia, unspecified: Secondary | ICD-10-CM | POA: Diagnosis not present

## 2021-07-22 ENCOUNTER — Telehealth: Payer: Self-pay | Admitting: Neurology

## 2021-07-22 NOTE — Telephone Encounter (Signed)
Pt spoke with pharmacy, who told her they had the Xeomin and needed an okay from [place] office to send medication.  ?Pharmacy is not sure if Xeomin would be at [facility] office by 11am Wednday (pts appt). ?Pt would like a call back concerning weather she needs to reschedule or not.  ?[phone] ?

## 2021-07-24 ENCOUNTER — Telehealth: Payer: Self-pay | Admitting: Neurology

## 2021-07-24 ENCOUNTER — Encounter: Payer: Self-pay | Admitting: Neurology

## 2021-07-24 ENCOUNTER — Ambulatory Visit: Payer: PPO | Admitting: Neurology

## 2021-07-24 VITALS — BP 145/73 | HR 74 | Ht 64.0 in | Wt 149.0 lb

## 2021-07-24 DIAGNOSIS — G243 Spasmodic torticollis: Secondary | ICD-10-CM | POA: Diagnosis not present

## 2021-07-24 NOTE — Telephone Encounter (Signed)
Received (1) 100 unit vial of Xeomin from [facility]. ?

## 2021-07-24 NOTE — Progress Notes (Signed)
? ? ? ?PATIENT: Cassandra Decker Seen ?DOB: 22-Mar-1946 ? ?Chief Complaint  ?Patient presents with  ? Cervical Dystonia  ?  Xeomin 100 units x 1 vials - specialty pharmacy  ?  ? ?HISTORICAL ? ?ARNESIA VINCELETTE is a 76 years old right-handed female, alone at today's clinical visit, seen in refer by  Laroy Apple, her primary care Dr. Renelda Mom for evaluation of neck pain, abnormal neck posture ? ?She had a history of hyperlipidemia, polymyalgia rheumatica, has been treated with prednisone 5 mg daily for few years, borderline diabetes, she works as Glass blower/designer for [employer] ? ?Around 2010, she began to noticed neck discomfort, she tends to hold her neck to the right side, gradually getting worse, to the point of difficulty keeping her neck straight, tendency of head titubation, constant posterior neck pain, difficulty turning her neck to the left side, she also developed bilateral shoulder pain. She has difficulty driving, difficulty turning. ? ?She has to hold her chin frequently to keep her neck in a straight position, she denies gait difficulty, no bowel and bladder incontinence.  ? ?She has gone through physical therapy, chiropractor, epidural injection without significant improvement  ? ?She had MRI of cervical spine at orthopedic clinic in August 2016, multilevel cervical degenerative changes, most severe at C5-6, with mild-to-moderate canal stenosis, no cord signal changes, was not able to reviewed the film ? ?UPDATE March 28th 2017: ?We have personally reviewed MRI of cervical spine August 2016: Moderate multilevel cervical disc degeneration with moderate<BR>spinal stenosis at C5-6.  Mild spinal stenosis at C6-7 3. Mild-to-moderate multilevel foraminal stenosis as above.  ?Potential side effect of xeomin was explained to the patient, this is her first EMG guided xeomin injection ? ?UPDATE June 29th 2017: ?She responded to her initial xeomin injection in March 28 very well, we used 200 units of xeomin, no  significant side effect, took about 2 weeks to take effect, the benefit lasts about 10 weeks, in the past 2 weeks, she noticed recurrent symptoms, neck pulling to her right side  ? ?UPDATE Oct 5th 2017: ?She received 200 units of xeomin had previous injection on September 27 2015, but she got flulike illness shortly afterwards, she did notice worsening right-sided neck pain, pulling of her neck at the end of 3 months. ? ?UPDATE June 11 2016: ?She responded very well to previous 300 units xeomin injection in October 2017, denies significant side effect, but was not able to keep every 3 months injection due to high co-pay/medication cause. ? ?UPDATE Jan 14 2017: ?She responded very well to previous injection in March, does has transient swallowing difficulty, change of voice, ? ?UPDATE October 28 2017: ?She responded very well to previous injection, we only used xeomin 200 units ? ?UPDATE Feb 10 2018: ?She responded very well to previous Xeomin injection, there was no significant side effect noticed. ? ?UPDATE May 12 2018: ?She did well with previous injection, no significant side effect noted. ? ?UPDATE Aug 18 2018: ?She did well with previous injection, there was no significant side effect noted, we used xeomin 100 units ? ?UPDATE Sept 14 2020: ?She did well with previous injection, she was recently treated with steroid for polymyalgia rheumatica, right temporal artery biopsy was negative, also suffered elevated glucose, right foot stress fracture without injury ? ?UPDATE Mar 17 2019: ?She did well with previous injection in September 2020 ? ?UPDATE June 22 2019: ?She responded very well to previous injection ? ?UPDATE October 05 2019: ?She tolerated  injection well, did help her neck pain, range of motion, no significant side effect noted. ? ?UPDATE Jan 11 2020: ?She complains of posterior neck muscle pain, achiness ? ?Update April 25, 2020 ?She complains of right shoulder area pain, responding well to previous  injection ? ?Update Aug 13, 2020: ?She responded well to previous injection, did not help her neck pain, ? ?Update April 16, 2021: ?Her husband was ill during the summer, needed intubation, she missed multiple previous appointment, overall her neck was doing well, recently noticed tension at bilateral levator scapular, tendency to turn left, occasionally titubation ? ?Update July 24, 2021 ?She complains of worsening bilateral neck pain, ? ?ASSESSMENT AND PLAN ? ?Cervical dystonia: ? ?She has slight anterocollis, mild left shoulder elevation, tenderness of bilateral levator scapular at scapular insertion ? ?Her first EMG guided xeomin injection in March 28th 2017 ? ?Used Xeomin 200units under EMG guidance   ? ?Right longissimus capitis 25 units ?Right splenius capitis 25 ?Right splenius cervix 25 units ?Right levator scapula 25 ? ?Left levator scapular 25 ?Left splenius cervix 25 ?Left iliocostalis 25 units ?Left upper trapezius 25 units ? ?She tolerated the injection well,   Return in 3 months for repeat injections  ? ?Marcial Pacas, M.D. Ph.D. ? ?[facility] ?[address], [address] ?[place], [place] [address] ?Ph: [phone]) [phone] ?Fax: [phone]  ? ?MN:[id number] Ron Agee, MD. Dr. Nicholos Johns ? ?

## 2021-07-24 NOTE — Telephone Encounter (Signed)
Please order xeomin 600 units for rest of 2023 injection ?

## 2021-07-24 NOTE — Progress Notes (Signed)
Xeomin 100 units  ?[facility] [phone] ?Exp-08-2023 ?[phone] ?[facility] ? ?Sample Xeomin 100 units x 1 vial  ?[facility] [phone] ?[phone] ?Exp-02-2022  ? ?

## 2021-08-07 DIAGNOSIS — N959 Unspecified menopausal and perimenopausal disorder: Secondary | ICD-10-CM | POA: Diagnosis not present

## 2021-08-07 DIAGNOSIS — Z1231 Encounter for screening mammogram for malignant neoplasm of breast: Secondary | ICD-10-CM | POA: Diagnosis not present

## 2021-08-07 DIAGNOSIS — M8589 Other specified disorders of bone density and structure, multiple sites: Secondary | ICD-10-CM | POA: Diagnosis not present

## 2021-08-07 DIAGNOSIS — M8580 Other specified disorders of bone density and structure, unspecified site: Secondary | ICD-10-CM | POA: Diagnosis not present

## 2021-08-12 DIAGNOSIS — M353 Polymyalgia rheumatica: Secondary | ICD-10-CM | POA: Diagnosis not present

## 2021-08-12 DIAGNOSIS — E663 Overweight: Secondary | ICD-10-CM | POA: Diagnosis not present

## 2021-08-12 DIAGNOSIS — R519 Headache, unspecified: Secondary | ICD-10-CM | POA: Diagnosis not present

## 2021-08-12 DIAGNOSIS — M79672 Pain in left foot: Secondary | ICD-10-CM | POA: Diagnosis not present

## 2021-08-12 DIAGNOSIS — Z6826 Body mass index (BMI) 26.0-26.9, adult: Secondary | ICD-10-CM | POA: Diagnosis not present

## 2021-08-12 DIAGNOSIS — M79671 Pain in right foot: Secondary | ICD-10-CM | POA: Diagnosis not present

## 2021-08-12 DIAGNOSIS — J309 Allergic rhinitis, unspecified: Secondary | ICD-10-CM | POA: Diagnosis not present

## 2021-08-12 DIAGNOSIS — G8929 Other chronic pain: Secondary | ICD-10-CM | POA: Diagnosis not present

## 2021-09-10 DIAGNOSIS — M76822 Posterior tibial tendinitis, left leg: Secondary | ICD-10-CM | POA: Diagnosis not present

## 2021-09-10 DIAGNOSIS — M76821 Posterior tibial tendinitis, right leg: Secondary | ICD-10-CM | POA: Diagnosis not present

## 2021-09-17 DIAGNOSIS — M353 Polymyalgia rheumatica: Secondary | ICD-10-CM | POA: Diagnosis not present

## 2021-09-17 DIAGNOSIS — G500 Trigeminal neuralgia: Secondary | ICD-10-CM | POA: Diagnosis not present

## 2021-09-17 DIAGNOSIS — E559 Vitamin D deficiency, unspecified: Secondary | ICD-10-CM | POA: Diagnosis not present

## 2021-09-17 DIAGNOSIS — E785 Hyperlipidemia, unspecified: Secondary | ICD-10-CM | POA: Diagnosis not present

## 2021-09-17 DIAGNOSIS — I10 Essential (primary) hypertension: Secondary | ICD-10-CM | POA: Diagnosis not present

## 2021-09-17 DIAGNOSIS — J309 Allergic rhinitis, unspecified: Secondary | ICD-10-CM | POA: Diagnosis not present

## 2021-09-17 DIAGNOSIS — R519 Headache, unspecified: Secondary | ICD-10-CM | POA: Diagnosis not present

## 2021-09-17 DIAGNOSIS — E039 Hypothyroidism, unspecified: Secondary | ICD-10-CM | POA: Diagnosis not present

## 2021-09-17 DIAGNOSIS — E1169 Type 2 diabetes mellitus with other specified complication: Secondary | ICD-10-CM | POA: Diagnosis not present

## 2021-09-17 DIAGNOSIS — Z6826 Body mass index (BMI) 26.0-26.9, adult: Secondary | ICD-10-CM | POA: Diagnosis not present

## 2021-09-26 MED ORDER — XEOMIN 100 UNITS IM SOLR: 100.0000 [IU] | INTRAMUSCULAR | 3 refills | Status: DC

## 2021-09-26 NOTE — Addendum Note (Signed)
Addended by: Noberto Retort C on: 09/26/2021 04:30 PM   Modules accepted: Orders

## 2021-09-26 NOTE — Telephone Encounter (Signed)
Refills have been sent to the requested pharmacy.

## 2021-09-26 NOTE — Telephone Encounter (Signed)
Please send Botox Rx to [place].

## 2021-09-27 ENCOUNTER — Telehealth: Payer: Self-pay | Admitting: Neurology

## 2021-09-27 NOTE — Telephone Encounter (Signed)
Pt asking for a call back to r/s her Xeomin Injection

## 2021-10-02 NOTE — Telephone Encounter (Signed)
I called pt to get her Xeomin Injection rescheduled.

## 2021-10-08 DIAGNOSIS — M76822 Posterior tibial tendinitis, left leg: Secondary | ICD-10-CM | POA: Diagnosis not present

## 2021-10-08 DIAGNOSIS — M76821 Posterior tibial tendinitis, right leg: Secondary | ICD-10-CM | POA: Diagnosis not present

## 2021-10-28 ENCOUNTER — Ambulatory Visit: Payer: PPO | Admitting: Neurology

## 2021-11-07 DIAGNOSIS — E1169 Type 2 diabetes mellitus with other specified complication: Secondary | ICD-10-CM | POA: Diagnosis not present

## 2021-11-07 DIAGNOSIS — R519 Headache, unspecified: Secondary | ICD-10-CM | POA: Diagnosis not present

## 2021-11-07 DIAGNOSIS — E039 Hypothyroidism, unspecified: Secondary | ICD-10-CM | POA: Diagnosis not present

## 2021-11-21 ENCOUNTER — Ambulatory Visit: Payer: PPO | Admitting: Neurology

## 2021-11-21 ENCOUNTER — Telehealth: Payer: Self-pay | Admitting: Neurology

## 2021-11-21 ENCOUNTER — Encounter: Payer: Self-pay | Admitting: Neurology

## 2021-11-21 VITALS — BP 157/81 | HR 91 | Ht 66.0 in | Wt 156.5 lb

## 2021-11-21 DIAGNOSIS — G243 Spasmodic torticollis: Secondary | ICD-10-CM | POA: Diagnosis not present

## 2021-11-21 NOTE — Telephone Encounter (Signed)
Please make sure she has xeomin for next time. Thank you

## 2021-11-21 NOTE — Progress Notes (Signed)
PATIENT: Cassandra Decker DOB: 1945/05/11  Chief Complaint  Patient presents with   Cervical Dystonia    Xeomin 100 units x 1 vials - specialty pharmacy     HISTORICAL  Cassandra Decker is a 76 years old right-handed female, alone at today's clinical visit, seen in refer by  Laroy Apple, her primary care Dr. Renelda Mom for evaluation of neck pain, abnormal neck posture  She had a history of hyperlipidemia, polymyalgia rheumatica, has been treated with prednisone 5 mg daily for few years, borderline diabetes, she works as Glass blower/designer for [employer]  Around 2010, she began to noticed neck discomfort, she tends to hold her neck to the right side, gradually getting worse, to the point of difficulty keeping her neck straight, tendency of head titubation, constant posterior neck pain, difficulty turning her neck to the left side, she also developed bilateral shoulder pain. She has difficulty driving, difficulty turning.  She has to hold her chin frequently to keep her neck in a straight position, she denies gait difficulty, no bowel and bladder incontinence.   She has gone through physical therapy, chiropractor, epidural injection without significant improvement   She had MRI of cervical spine at orthopedic clinic in August 2016, multilevel cervical degenerative changes, most severe at C5-6, with mild-to-moderate canal stenosis, no cord signal changes, was not able to reviewed the film  UPDATE March 28th 2017: We have personally reviewed MRI of cervical spine August 2016: Moderate multilevel cervical disc degeneration with moderate<BR>spinal stenosis at C5-6.  Mild spinal stenosis at C6-7 3. Mild-to-moderate multilevel foraminal stenosis as above.  Potential side effect of xeomin was explained to the patient, this is her first EMG guided xeomin injection  UPDATE June 29th 2017: She responded to her initial xeomin injection in March 28 very well, we used 200 units of xeomin, no  significant side effect, took about 2 weeks to take effect, the benefit lasts about 10 weeks, in the past 2 weeks, she noticed recurrent symptoms, neck pulling to her right side   UPDATE Oct 5th 2017: She received 200 units of xeomin had previous injection on September 27 2015, but she got flulike illness shortly afterwards, she did notice worsening right-sided neck pain, pulling of her neck at the end of 3 months.  UPDATE June 11 2016: She responded very well to previous 300 units xeomin injection in October 2017, denies significant side effect, but was not able to keep every 3 months injection due to high co-pay/medication cause.  UPDATE Jan 14 2017: She responded very well to previous injection in March, does has transient swallowing difficulty, change of voice,  UPDATE October 28 2017: She responded very well to previous injection, we only used xeomin 200 units  UPDATE Feb 10 2018: She responded very well to previous Xeomin injection, there was no significant side effect noticed.  UPDATE May 12 2018: She did well with previous injection, no significant side effect noted.  UPDATE Aug 18 2018: She did well with previous injection, there was no significant side effect noted, we used xeomin 100 units  UPDATE Sept 14 2020: She did well with previous injection, she was recently treated with steroid for polymyalgia rheumatica, right temporal artery biopsy was negative, also suffered elevated glucose, right foot stress fracture without injury  UPDATE Mar 17 2019: She did well with previous injection in September 2020  UPDATE June 22 2019: She responded very well to previous injection  UPDATE October 05 2019: She tolerated  injection well, did help her neck pain, range of motion, no significant side effect noted.  UPDATE Jan 11 2020: She complains of posterior neck muscle pain, achiness  Update April 25, 2020 She complains of right shoulder area pain, responding well to previous  injection  Update Aug 13, 2020: She responded well to previous injection, did not help her neck pain,  Update April 16, 2021: Her husband was ill during the summer, needed intubation, she missed multiple previous appointment, overall her neck was doing well, recently noticed tension at bilateral levator scapular, tendency to turn left, occasionally titubation  Update July 24, 2021 She complains of worsening bilateral neck pain,  Update November 21, 2021: She responded well to previous injection, did have some neck pain, difficulty turning towards the left side  ASSESSMENT AND PLAN  Cervical dystonia:  She has slight anterocollis, mild left shoulder elevation, tenderness of bilateral levator scapular at scapular insertion  Her first EMG guided xeomin injection in March 28th 2017  Used Xeomin 200units under EMG guidance    Right longissimus capitis 25 units Right splenius capitis 25 Right splenius cervix 25 units Right levator scapula 25  Left levator scapular 25 Left splenius cervix 25 Left iliocostalis 25 units Left sternocleidomastoid 25 units  She tolerated the injection well,   Return in 3 months for repeat injections   Marcial Pacas, M.D. Ph.D.  [facility] [address] [place],  [address] Ph: [phone] Fax: [phone]   CC:Laroy Apple, MD. Dr. Nicholos Johns

## 2021-11-21 NOTE — Progress Notes (Signed)
Xeomin 200 units x 1 vial  LOT #: [id number] NDC: 8718-3672-55 EXP: 2023-10  SP

## 2021-11-27 NOTE — Telephone Encounter (Signed)
Pending

## 2021-11-29 IMAGING — MR MR CERVICAL SPINE W/O CM
4 of 5 series · 28 of 48 positions shown · non-contrast
Comparison: 09/07/2007 and prior.

CLINICAL DATA: Right upper extremity pain in weakness.

EXAM:
MRI CERVICAL SPINE WITHOUT CONTRAST
TECHNIQUE: Multiplanar, multisequence MR imaging of the cervical spine was
performed. No intravenous contrast was administered.

[Series 5: T2 · sagittal · 3.0mm · 0.55mm/px · 7 of 18 slices shown (1 of 2)]
[im 1/18]
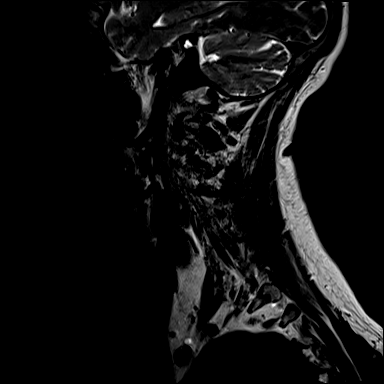
[im 3/18]
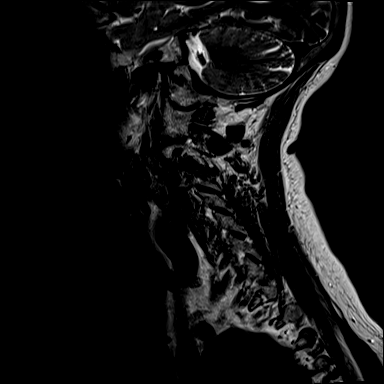
[im 6/18]
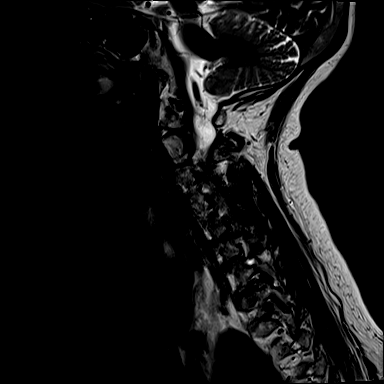
[im 9/18]
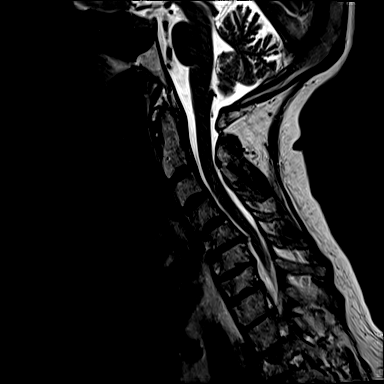
[im 12/18]
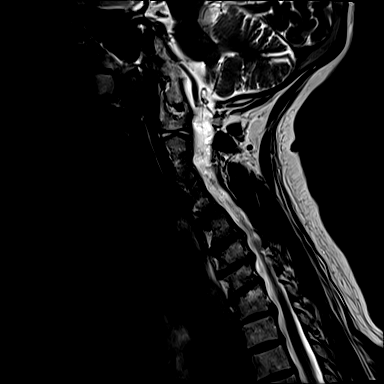
[im 15/18]
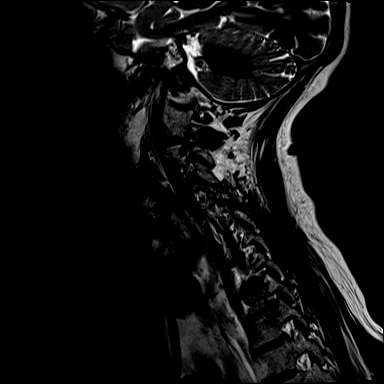
[im 18/18]
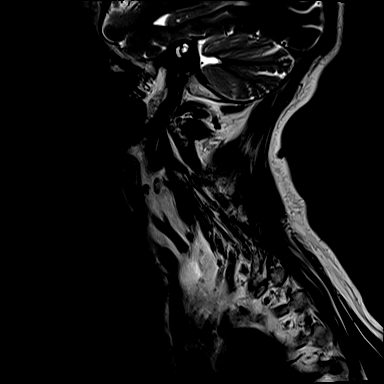

[Series 6: T1 · sagittal · 3.0mm · 0.66mm/px · 7 of 18 slices shown]
[im 1/18]
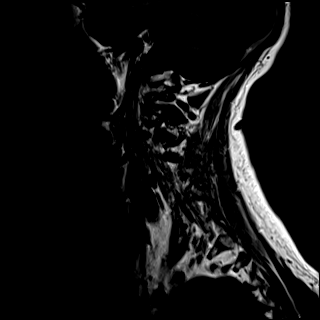
[im 3/18]
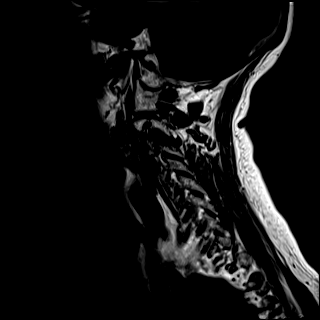
[im 6/18]
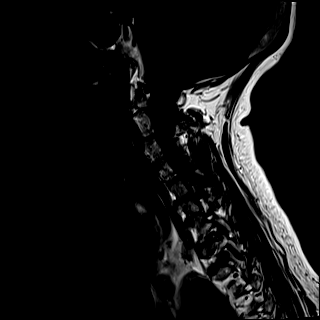
[im 9/18]
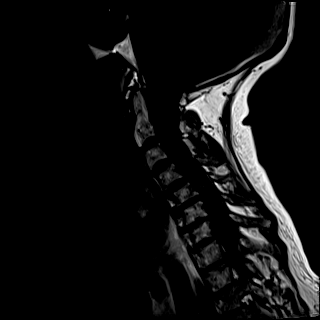
[im 12/18]
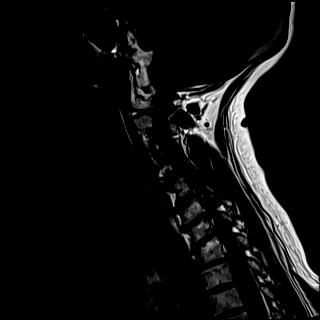
[im 15/18]
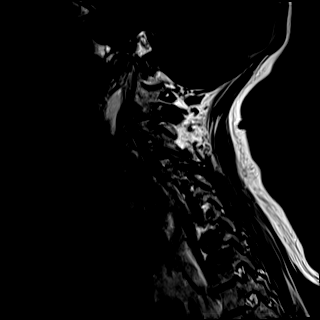
[im 18/18]
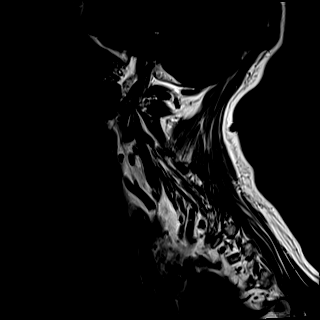

[Series 7: STIR · sagittal · 3.0mm · 0.33mm/px · 5 of 18 slices shown]
[im 1/18]
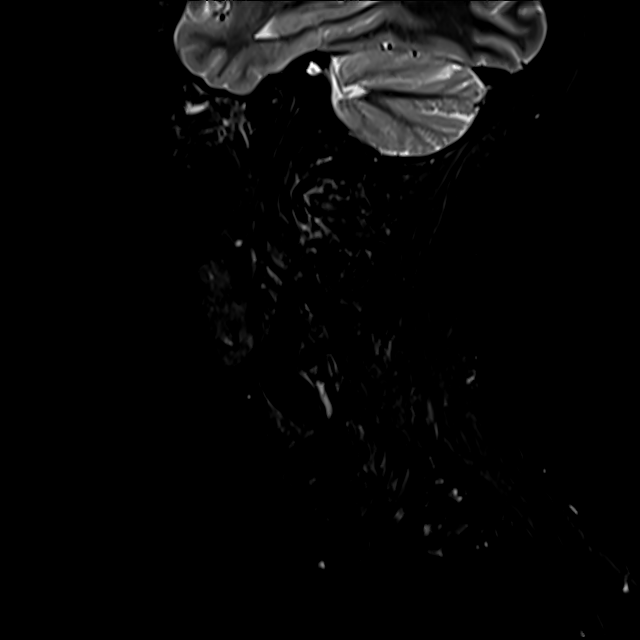
[im 3/18]
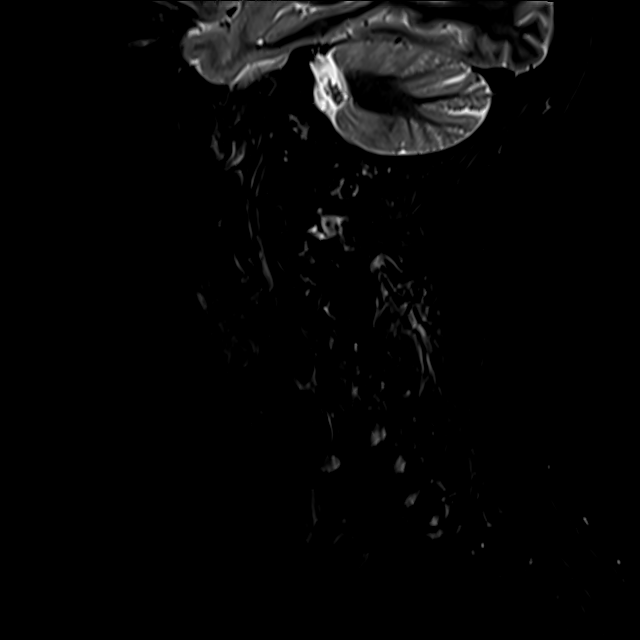
[im 5/18]
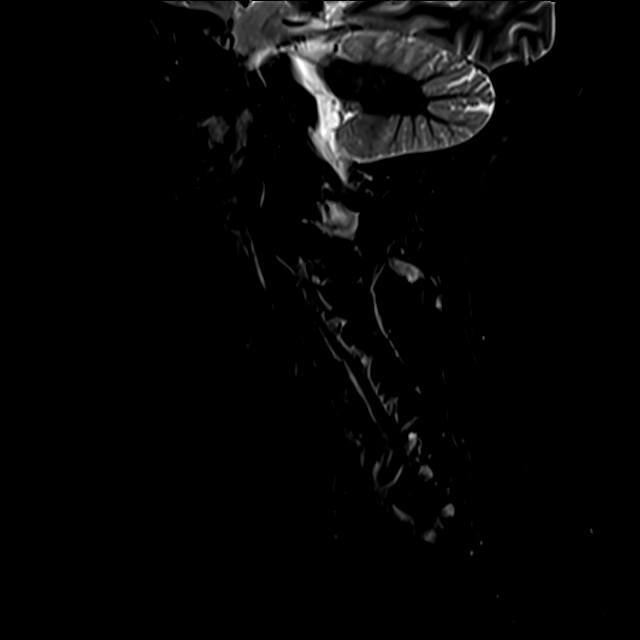
[im 10/18]
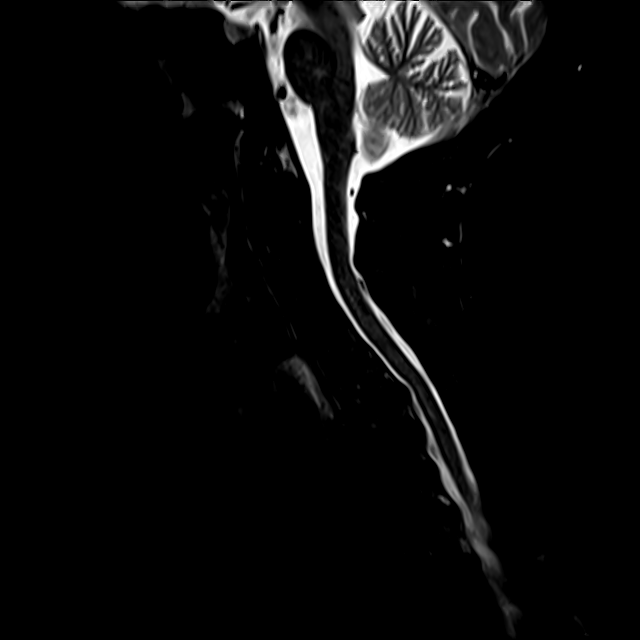
[im 15/18]
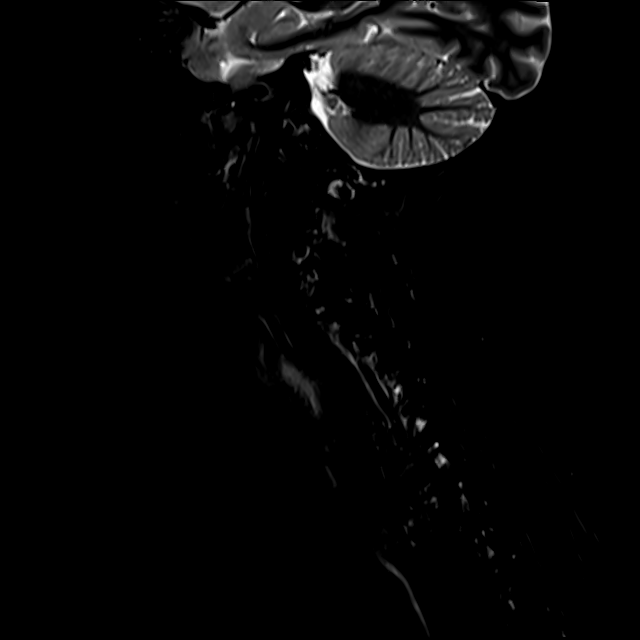

[Series 8: T2 · axial · 3.0mm · 0.50mm/px · z∈[-61,+25]mm · 9 of 30 slices shown (2 of 2)]
[im 1/30]
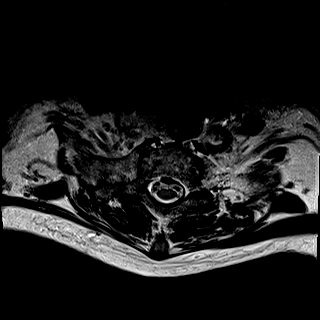
[im 5/30]
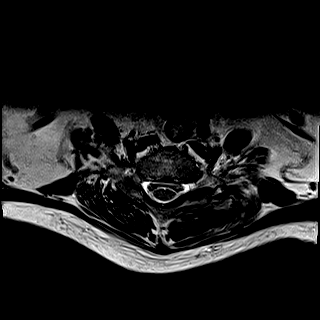
[im 10/30]
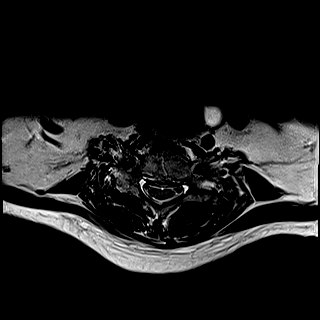
[im 13/30]
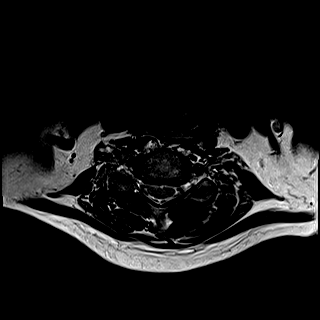
[im 15/30]
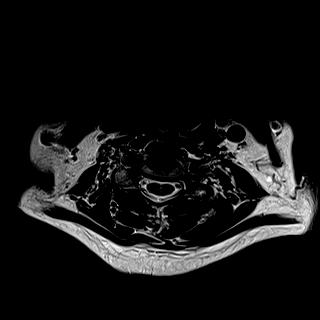
[im 17/30]
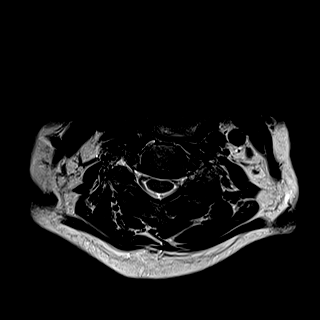
[im 20/30]
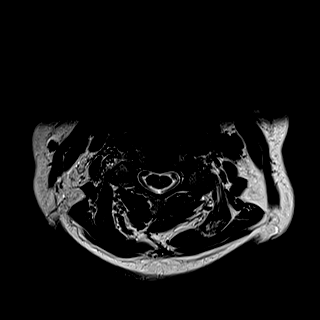
[im 25/30]
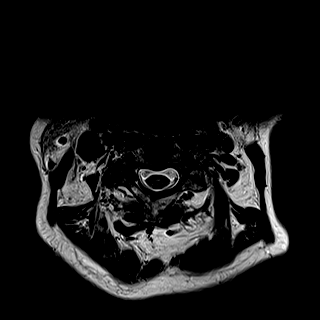
[im 30/30]
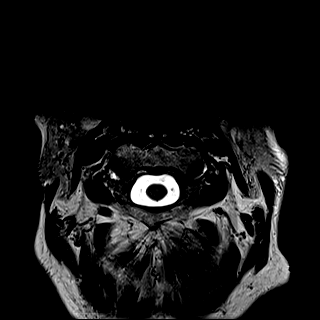

[28 of 48 positions shown; findings below may reference images not displayed]

FINDINGS: Alignment: Reversal of lordosis. Minimal stepwise grade 1
anterolisthesis at the C3-C5 levels. Stepwise grade 1 retrolisthesis
at the C5-C7 levels.

Vertebrae: Mild Modic type 2 endplate degenerative changes.
Vertebral body heights are preserved. No aggressive osseous lesion.

Cord: Normal signal and morphology.

Posterior Fossa, vertebral arteries: Negative.

Disc levels: Multilevel desiccation.

C2-3: Uncovertebral and facet hypertrophy. Shallow central
protrusion. Patent spinal canal and left neural foramen. Mild right
neural foraminal narrowing.

C3-4: Small disc osteophyte complex with left predominant
uncovertebral/facet hypertrophy. Patent spinal canal and right
neural foramen. Moderate left neural foraminal narrowing.

C4-5: Disc osteophyte complex with left predominant
uncovertebral/facet hypertrophy. Patent spinal canal and right
neural foramen. Mild left neural foraminal narrowing.

C5-6: Disc osteophyte complex with superimposed central protrusion
abutting the ventral cord. Uncovertebral and facet hypertrophy. Mild
spinal canal and bilateral neural foraminal narrowing.

C6-7: Disc osteophyte complex with shallow right foraminal
protrusion. Uncovertebral and facet hypertrophy. Prominent
ligamentum flavum. Mild spinal canal and bilateral neural foraminal
narrowing.

C7-T1: Mild disc bulge with uncovertebral and facet hypertrophy.
Patent spinal canal and neural foramen.

Paraspinal tissues: Partially imaged incidental cystic lesion along
the left tracheoesophageal groove at the C6 level measuring 1.3 x
0.7 cm, more conspicuous than prior exam and likely benign.
IMPRESSION: Moderate left C3-4 neural foraminal narrowing.

Mild right C2-3, left C4-5, bilateral C5-7 neural foraminal
narrowing.

Mild spinal canal narrowing at the C5-7 levels.

## 2021-12-31 DIAGNOSIS — E1169 Type 2 diabetes mellitus with other specified complication: Secondary | ICD-10-CM | POA: Diagnosis not present

## 2021-12-31 DIAGNOSIS — R519 Headache, unspecified: Secondary | ICD-10-CM | POA: Diagnosis not present

## 2021-12-31 DIAGNOSIS — E039 Hypothyroidism, unspecified: Secondary | ICD-10-CM | POA: Diagnosis not present

## 2021-12-31 DIAGNOSIS — Z23 Encounter for immunization: Secondary | ICD-10-CM | POA: Diagnosis not present

## 2021-12-31 DIAGNOSIS — M79672 Pain in left foot: Secondary | ICD-10-CM | POA: Diagnosis not present

## 2021-12-31 DIAGNOSIS — G500 Trigeminal neuralgia: Secondary | ICD-10-CM | POA: Diagnosis not present

## 2021-12-31 DIAGNOSIS — I10 Essential (primary) hypertension: Secondary | ICD-10-CM | POA: Diagnosis not present

## 2021-12-31 DIAGNOSIS — E559 Vitamin D deficiency, unspecified: Secondary | ICD-10-CM | POA: Diagnosis not present

## 2021-12-31 DIAGNOSIS — M353 Polymyalgia rheumatica: Secondary | ICD-10-CM | POA: Diagnosis not present

## 2021-12-31 DIAGNOSIS — Z6826 Body mass index (BMI) 26.0-26.9, adult: Secondary | ICD-10-CM | POA: Diagnosis not present

## 2021-12-31 DIAGNOSIS — M79671 Pain in right foot: Secondary | ICD-10-CM | POA: Diagnosis not present

## 2021-12-31 DIAGNOSIS — E785 Hyperlipidemia, unspecified: Secondary | ICD-10-CM | POA: Diagnosis not present

## 2022-01-01 DIAGNOSIS — Z79899 Other long term (current) drug therapy: Secondary | ICD-10-CM | POA: Diagnosis not present

## 2022-01-01 DIAGNOSIS — E1169 Type 2 diabetes mellitus with other specified complication: Secondary | ICD-10-CM | POA: Diagnosis not present

## 2022-01-01 DIAGNOSIS — E559 Vitamin D deficiency, unspecified: Secondary | ICD-10-CM | POA: Diagnosis not present

## 2022-01-08 DIAGNOSIS — E119 Type 2 diabetes mellitus without complications: Secondary | ICD-10-CM | POA: Diagnosis not present

## 2022-02-10 DIAGNOSIS — H9201 Otalgia, right ear: Secondary | ICD-10-CM | POA: Diagnosis not present

## 2022-02-10 DIAGNOSIS — Z6827 Body mass index (BMI) 27.0-27.9, adult: Secondary | ICD-10-CM | POA: Diagnosis not present

## 2022-02-19 DIAGNOSIS — H02834 Dermatochalasis of left upper eyelid: Secondary | ICD-10-CM | POA: Diagnosis not present

## 2022-02-19 DIAGNOSIS — H02403 Unspecified ptosis of bilateral eyelids: Secondary | ICD-10-CM | POA: Diagnosis not present

## 2022-02-19 DIAGNOSIS — H02831 Dermatochalasis of right upper eyelid: Secondary | ICD-10-CM | POA: Diagnosis not present

## 2022-02-26 ENCOUNTER — Ambulatory Visit: Payer: PPO | Admitting: Neurology

## 2022-02-26 ENCOUNTER — Encounter: Payer: Self-pay | Admitting: Neurology

## 2022-02-26 VITALS — BP 181/84 | HR 91 | Ht 66.0 in | Wt 156.0 lb

## 2022-02-26 DIAGNOSIS — G243 Spasmodic torticollis: Secondary | ICD-10-CM

## 2022-02-26 MED ORDER — INCOBOTULINUMTOXINA 100 UNITS IM SOLR: 200.0000 [IU] | INTRAMUSCULAR | Status: AC

## 2022-02-26 NOTE — Telephone Encounter (Signed)
Please check on the supply of patient's xeomin, if she does not have any, please order 800 units all at every once for the year of  2024   If you have questions about this, please let me know

## 2022-02-26 NOTE — Progress Notes (Signed)
     PATIENT: Cassandra Decker DOB: 04-30-1945  Chief Complaint  Patient presents with   Cervical Dystonia    Xeomin 100 units x 1 vials - specialty pharmacy     HISTORICAL  Cassandra Decker is a 76 years old right-handed female, alone at today's clinical visit, seen in refer by  Laroy Apple, her primary care Dr. Renelda Mom for evaluation of neck pain, abnormal neck posture  She had a history of hyperlipidemia, polymyalgia rheumatica, has been treated with prednisone 5 mg daily for few years, borderline diabetes, she works as Glass blower/designer for [employer]  Around 2010, she began to noticed neck discomfort, she tends to hold her neck to the right side, gradually getting worse, to the point of difficulty keeping her neck straight, tendency of head titubation, constant posterior neck pain, difficulty turning her neck to the left side, she also developed bilateral shoulder pain. She has difficulty driving, difficulty turning.  She has to hold her chin frequently to keep her neck in a straight position, she denies gait difficulty, no bowel and bladder incontinence.   She has gone through physical therapy, chiropractor, epidural injection without significant improvement   She had MRI of cervical spine at orthopedic clinic in August 2016, multilevel cervical degenerative changes, most severe at C5-6, with mild-to-moderate canal stenosis, no cord signal changes, was not able to reviewed the film  MRI of cervical spine August 2016: Moderate multilevel cervical disc degeneration with moderate<BR>spinal stenosis at C5-6.  Mild spinal stenosis at C6-7 3. Mild-to-moderate multilevel foraminal stenosis as above.  Potential side effect of xeomin was explained to the patient, this is her first EMG guided xeomin injection was in March 2017  Update February 26, 2022 Over the years, she has been receiving low-dose xeomin injection, currently 200 units every 3 months, does help her neck pain, abnormal  neck posturing   Today she also complains of right facial deep aching pain, started since August 2023, severe for 3 weeks, now with some improvement, but still have moderate right preoccupy area pain, spreading to right retro-orbital, neck, constant achy pain, grinding sound in right TMJ, examination showed crepitus sensation with right TMJ movement, and tenderness at the joint, she has pending orthodontic appointment  ASSESSMENT AND PLAN  Cervical dystonia:  She has slight anterocollis, mild left shoulder elevation, tenderness of bilateral levator scapular at scapular insertion  Her first EMG guided xeomin injection in March 28th 2017  Used Xeomin 200units under EMG guidance    Right longissimus capitis 25 units Right splenius capitis 25 Right splenius cervix 25 units Right levator scapula 25 unit Right upper trapezius 25 units  Left levator scapular 25 Left splenius cervix 25 Left iliocostalis 25 units   She tolerated the injection well,   Return in 3 months for repeat injections   Marcial Pacas, M.D. Ph.D.  [facility] [address] [place], [place] [address] Ph: [phone] Fax: [phone]   CC:Laroy Apple, MD. Dr. Nicholos Johns

## 2022-02-26 NOTE — Progress Notes (Signed)
Xeomin 200 units  [id number] [id number] Exp-12/24 S/P

## 2022-02-27 NOTE — Telephone Encounter (Signed)
We will work on this request next week.

## 2022-03-03 ENCOUNTER — Telehealth: Payer: Self-pay

## 2022-03-03 NOTE — Telephone Encounter (Signed)
Xeomin PA needed.  MD is requesting 800 units for 1 year supply.   Dx code G24.3 EMG Guidance is needed for injections.

## 2022-03-04 ENCOUNTER — Other Ambulatory Visit (HOSPITAL_COMMUNITY): Payer: Self-pay

## 2022-03-04 NOTE — Telephone Encounter (Signed)
Attempted PA via Covermymeds- Key: [id number], PA cancelled out stating PA on file. Called [facility], patient has an active- indefinite Xeomin prior authorization on file. Approved 07/03/2015- until further notice.   Rep stated however the plan has a dispensing restriction of max 90 days at a time. Ran test claim, patient's copay is $200.00.  Phone# [phone]

## 2022-03-06 MED ORDER — XEOMIN 100 UNITS IM SOLR: 800.0000 [IU] | INTRAMUSCULAR | 0 refills | Status: DC

## 2022-03-06 NOTE — Addendum Note (Signed)
Addended by: Verlin Grills on: 03/06/2022 09:03 AM   Modules accepted: Orders

## 2022-03-06 NOTE — Telephone Encounter (Signed)
I have sent refill to [facility].

## 2022-05-15 DIAGNOSIS — M79672 Pain in left foot: Secondary | ICD-10-CM | POA: Diagnosis not present

## 2022-05-15 DIAGNOSIS — Z6827 Body mass index (BMI) 27.0-27.9, adult: Secondary | ICD-10-CM | POA: Diagnosis not present

## 2022-05-15 DIAGNOSIS — I10 Essential (primary) hypertension: Secondary | ICD-10-CM | POA: Diagnosis not present

## 2022-05-15 DIAGNOSIS — E559 Vitamin D deficiency, unspecified: Secondary | ICD-10-CM | POA: Diagnosis not present

## 2022-05-15 DIAGNOSIS — G500 Trigeminal neuralgia: Secondary | ICD-10-CM | POA: Diagnosis not present

## 2022-05-15 DIAGNOSIS — R220 Localized swelling, mass and lump, head: Secondary | ICD-10-CM | POA: Diagnosis not present

## 2022-05-15 DIAGNOSIS — H9190 Unspecified hearing loss, unspecified ear: Secondary | ICD-10-CM | POA: Diagnosis not present

## 2022-05-15 DIAGNOSIS — R519 Headache, unspecified: Secondary | ICD-10-CM | POA: Diagnosis not present

## 2022-05-15 DIAGNOSIS — E039 Hypothyroidism, unspecified: Secondary | ICD-10-CM | POA: Diagnosis not present

## 2022-05-15 DIAGNOSIS — S0993XA Unspecified injury of face, initial encounter: Secondary | ICD-10-CM | POA: Diagnosis not present

## 2022-05-15 DIAGNOSIS — S0083XA Contusion of other part of head, initial encounter: Secondary | ICD-10-CM | POA: Diagnosis not present

## 2022-05-15 DIAGNOSIS — M79671 Pain in right foot: Secondary | ICD-10-CM | POA: Diagnosis not present

## 2022-05-15 DIAGNOSIS — E1169 Type 2 diabetes mellitus with other specified complication: Secondary | ICD-10-CM | POA: Diagnosis not present

## 2022-05-15 DIAGNOSIS — R200 Anesthesia of skin: Secondary | ICD-10-CM | POA: Diagnosis not present

## 2022-05-15 DIAGNOSIS — M353 Polymyalgia rheumatica: Secondary | ICD-10-CM | POA: Diagnosis not present

## 2022-05-15 DIAGNOSIS — E785 Hyperlipidemia, unspecified: Secondary | ICD-10-CM | POA: Diagnosis not present

## 2022-05-19 DIAGNOSIS — E039 Hypothyroidism, unspecified: Secondary | ICD-10-CM | POA: Diagnosis not present

## 2022-05-19 DIAGNOSIS — Z79899 Other long term (current) drug therapy: Secondary | ICD-10-CM | POA: Diagnosis not present

## 2022-05-19 DIAGNOSIS — E785 Hyperlipidemia, unspecified: Secondary | ICD-10-CM | POA: Diagnosis not present

## 2022-05-19 DIAGNOSIS — E1169 Type 2 diabetes mellitus with other specified complication: Secondary | ICD-10-CM | POA: Diagnosis not present

## 2022-05-19 NOTE — Telephone Encounter (Signed)
Called [facility] to schedule delivery for 06/04/22 appt. They stated they've been trying to reach out to pt to collect copay and receive consent for delivery. I called pt to ask her to contact them so they would ship medication. Pt became upset and stated that in the past Dr. Krista Blue had ordered a year's worth of Xeomin for her so she would only have to pay the pharmacy one copay. I informed pt of note from [facility] dated 12/5 that states medication can only be dispensed 90 days at a time. Pt requested a member of the clinical staff look into this and give her a call back.

## 2022-05-20 DIAGNOSIS — H02403 Unspecified ptosis of bilateral eyelids: Secondary | ICD-10-CM | POA: Diagnosis not present

## 2022-05-20 DIAGNOSIS — E119 Type 2 diabetes mellitus without complications: Secondary | ICD-10-CM | POA: Diagnosis not present

## 2022-05-20 DIAGNOSIS — H02834 Dermatochalasis of left upper eyelid: Secondary | ICD-10-CM | POA: Diagnosis not present

## 2022-05-20 DIAGNOSIS — H02831 Dermatochalasis of right upper eyelid: Secondary | ICD-10-CM | POA: Diagnosis not present

## 2022-05-20 DIAGNOSIS — Z01818 Encounter for other preprocedural examination: Secondary | ICD-10-CM | POA: Diagnosis not present

## 2022-05-20 NOTE — Telephone Encounter (Signed)
FYI for when pt comes in- per insurance, she has a dispensing max of 90 days at a time for Xeomin. We are not able to order a year's worth at once.

## 2022-05-20 NOTE — Telephone Encounter (Signed)
Ok, please make sure her supply is ready for next coming injection on March 6

## 2022-05-20 NOTE — Telephone Encounter (Signed)
Called and spoke to patient about Jillian's note. Patient was understanding but stated she would like to further discuss with provider at next appointment.

## 2022-05-22 NOTE — Telephone Encounter (Signed)
Updated appt note

## 2022-05-22 NOTE — Telephone Encounter (Signed)
Cassandra Decker is calling from [facility]. Stated Xeomin 100 will be delivered 2/28 with signature required.

## 2022-05-28 NOTE — Telephone Encounter (Signed)
Received (8) 100 unit vials of Xeomin.

## 2022-06-04 ENCOUNTER — Ambulatory Visit: Payer: PPO | Admitting: Neurology

## 2022-06-04 VITALS — BP 145/76 | HR 81 | Ht 66.0 in | Wt 156.0 lb

## 2022-06-04 DIAGNOSIS — G243 Spasmodic torticollis: Secondary | ICD-10-CM

## 2022-06-04 MED ORDER — GABAPENTIN 100 MG PO CAPS: 100.0000 mg | ORAL_CAPSULE | Freq: Three times a day (TID) | ORAL | 3 refills | Status: DC | PRN

## 2022-06-04 MED ORDER — INCOBOTULINUMTOXINA 100 UNITS IM SOLR: 100.0000 [IU] | Freq: Once | INTRAMUSCULAR | Status: AC

## 2022-06-04 NOTE — Progress Notes (Signed)
Xeomin 100 units x 2 vials [id number] [id number] Exp-02-2024 SP

## 2022-06-04 NOTE — Progress Notes (Signed)
     PATIENT: Cassandra Decker DOB: 1945-05-31  Chief Complaint  Patient presents with   Cervical Dystonia    Xeomin 100 units x 1 vials - specialty pharmacy     HISTORICAL  Cassandra Decker is a 77 years old right-handed female, alone at today's clinical visit, seen in refer by  Laroy Apple, her primary care Dr. Renelda Mom for evaluation of neck pain, abnormal neck posture  She had a history of hyperlipidemia, polymyalgia rheumatica, has been treated with prednisone 5 mg daily for few years, borderline diabetes, she works as Glass blower/designer for [employer]  Around 2010, she began to noticed neck discomfort, she tends to hold her neck to the right side, gradually getting worse, to the point of difficulty keeping her neck straight, tendency of head titubation, constant posterior neck pain, difficulty turning her neck to the left side, she also developed bilateral shoulder pain. She has difficulty driving, difficulty turning.  She has to hold her chin frequently to keep her neck in a straight position, she denies gait difficulty, no bowel and bladder incontinence.   She has gone through physical therapy, chiropractor, epidural injection without significant improvement   She had MRI of cervical spine at orthopedic clinic in August 2016, multilevel cervical degenerative changes, most severe at C5-6, with mild-to-moderate canal stenosis, no cord signal changes, was not able to reviewed the film  MRI of cervical spine August 2016: Moderate multilevel cervical disc degeneration with moderate<BR>spinal stenosis at C5-6.  Mild spinal stenosis at C6-7 3. Mild-to-moderate multilevel foraminal stenosis as above.  Potential side effect of xeomin was explained to the patient, this is her first EMG guided xeomin injection was in March 2017  Update February 26, 2022 Over the years, she has been receiving low-dose xeomin injection, currently 200 units every 3 months, does help her neck pain, abnormal  neck posturing   Today she also complains of right facial deep aching pain, started since August 2023, severe for 3 weeks, now with some improvement, but still have moderate right preoccupy area pain, spreading to right retro-orbital, neck, constant achy pain, grinding sound in right TMJ, examination showed crepitus sensation with right TMJ movement, and tenderness at the joint, she has pending orthodontic appointment  UPDATE June 04 2022: She fell in February 2024, had bruises on her right face, still has right upper tooth numbness, mild achy pain, increased neck muscle tension with stress, younger brother suffered bilateral carotid artery significant stenosis requiring endarterectomy, Xeomin injection works for her,  ASSESSMENT AND PLAN  Cervical dystonia:  She has slight anterocollis, mild left shoulder elevation, tenderness of bilateral levator scapular at scapular insertion  Her first EMG guided xeomin injection in March 28th 2017  Used Xeomin 200units under EMG guidance    Right longissimus capitis 25 units Right splenius capitis 25 Right splenius cervix 25 units Right levator scapula 25 unit Right upper trapezius 12.5 units Right iliocostalis 12.5 units  Left levator scapular 25 Left splenius cervix 25 Left iliocostalis 25 units  (very active)  She tolerated the injection well,   Return in 3 months for repeat injections   Marcial Pacas, M.D. Ph.D.  [facility] [address], [place], [place] [address] Ph: [phone] Fax: [phone]   CC:Laroy Apple, MD. Dr. Nicholos Johns

## 2022-06-25 DIAGNOSIS — J309 Allergic rhinitis, unspecified: Secondary | ICD-10-CM | POA: Diagnosis not present

## 2022-06-25 DIAGNOSIS — E1169 Type 2 diabetes mellitus with other specified complication: Secondary | ICD-10-CM | POA: Diagnosis not present

## 2022-06-25 DIAGNOSIS — E559 Vitamin D deficiency, unspecified: Secondary | ICD-10-CM | POA: Diagnosis not present

## 2022-06-25 DIAGNOSIS — M810 Age-related osteoporosis without current pathological fracture: Secondary | ICD-10-CM | POA: Diagnosis not present

## 2022-06-25 DIAGNOSIS — E039 Hypothyroidism, unspecified: Secondary | ICD-10-CM | POA: Diagnosis not present

## 2022-07-28 DIAGNOSIS — Z9181 History of falling: Secondary | ICD-10-CM | POA: Diagnosis not present

## 2022-07-28 DIAGNOSIS — Z1331 Encounter for screening for depression: Secondary | ICD-10-CM | POA: Diagnosis not present

## 2022-07-28 DIAGNOSIS — Z0000 Encounter for general adult medical examination without abnormal findings: Secondary | ICD-10-CM | POA: Diagnosis not present

## 2022-08-04 ENCOUNTER — Other Ambulatory Visit: Payer: Self-pay

## 2022-08-04 MED ORDER — XEOMIN 100 UNITS IM SOLR: 800.0000 [IU] | INTRAMUSCULAR | 0 refills | Status: DC

## 2022-08-11 ENCOUNTER — Telehealth: Payer: Self-pay | Admitting: Neurology

## 2022-08-11 MED ORDER — XEOMIN 100 UNITS IM SOLR: 800.0000 [IU] | INTRAMUSCULAR | 0 refills | Status: DC

## 2022-08-11 NOTE — Telephone Encounter (Signed)
Cassandra Decker called from [facility] by Birdie. Stated she need incobotulinumtoxinA (XEOMIN) 100 units SOLR injection refaxed to [phone].

## 2022-08-11 NOTE — Addendum Note (Signed)
Addended by: Eather Colas E on: 08/11/2022 01:11 PM   Modules accepted: Orders

## 2022-08-11 NOTE — Telephone Encounter (Signed)
Routing to dr. Terrace Arabia so she can print and sign this for [place] to fax to them as requested

## 2022-08-11 NOTE — Telephone Encounter (Signed)
Refill sent as requested. 

## 2022-08-26 DIAGNOSIS — E039 Hypothyroidism, unspecified: Secondary | ICD-10-CM | POA: Diagnosis not present

## 2022-08-26 DIAGNOSIS — E559 Vitamin D deficiency, unspecified: Secondary | ICD-10-CM | POA: Diagnosis not present

## 2022-08-26 DIAGNOSIS — M19012 Primary osteoarthritis, left shoulder: Secondary | ICD-10-CM | POA: Diagnosis not present

## 2022-08-26 DIAGNOSIS — Z6826 Body mass index (BMI) 26.0-26.9, adult: Secondary | ICD-10-CM | POA: Diagnosis not present

## 2022-08-26 DIAGNOSIS — E1169 Type 2 diabetes mellitus with other specified complication: Secondary | ICD-10-CM | POA: Diagnosis not present

## 2022-08-26 DIAGNOSIS — M2550 Pain in unspecified joint: Secondary | ICD-10-CM | POA: Diagnosis not present

## 2022-10-08 ENCOUNTER — Ambulatory Visit: Payer: PPO | Admitting: Neurology

## 2022-11-27 DIAGNOSIS — R197 Diarrhea, unspecified: Secondary | ICD-10-CM | POA: Diagnosis not present

## 2022-11-27 DIAGNOSIS — F330 Major depressive disorder, recurrent, mild: Secondary | ICD-10-CM | POA: Diagnosis not present

## 2022-11-27 DIAGNOSIS — E559 Vitamin D deficiency, unspecified: Secondary | ICD-10-CM | POA: Diagnosis not present

## 2022-11-27 DIAGNOSIS — E1169 Type 2 diabetes mellitus with other specified complication: Secondary | ICD-10-CM | POA: Diagnosis not present

## 2022-11-27 DIAGNOSIS — Z79899 Other long term (current) drug therapy: Secondary | ICD-10-CM | POA: Diagnosis not present

## 2022-11-27 DIAGNOSIS — F419 Anxiety disorder, unspecified: Secondary | ICD-10-CM | POA: Diagnosis not present

## 2022-11-27 DIAGNOSIS — M159 Polyosteoarthritis, unspecified: Secondary | ICD-10-CM | POA: Diagnosis not present

## 2022-11-27 DIAGNOSIS — Z6826 Body mass index (BMI) 26.0-26.9, adult: Secondary | ICD-10-CM | POA: Diagnosis not present

## 2022-12-02 DIAGNOSIS — R197 Diarrhea, unspecified: Secondary | ICD-10-CM | POA: Diagnosis not present

## 2022-12-03 ENCOUNTER — Encounter: Payer: Self-pay | Admitting: Neurology

## 2022-12-03 ENCOUNTER — Ambulatory Visit: Payer: PPO | Admitting: Neurology

## 2022-12-03 VITALS — BP 152/75 | HR 91 | Ht 63.0 in | Wt 146.5 lb

## 2022-12-03 DIAGNOSIS — G243 Spasmodic torticollis: Secondary | ICD-10-CM

## 2022-12-03 NOTE — Progress Notes (Signed)
     PATIENT: Cassandra Decker DOB: [birth year]  Chief Complaint  Patient presents with   Cervical Dystonia    Xeomin 100 units x 1 vials - specialty pharmacy     HISTORICAL  Cassandra Decker is a 77 years old right-handed female, alone at today's clinical visit, seen in refer by  Romero Belling, her primary care Dr. Andria Rhein for evaluation of neck pain, abnormal neck posture  She had a history of hyperlipidemia, polymyalgia rheumatica, has been treated with prednisone 5 mg daily for few years, borderline diabetes, she works as Print production planner for [employer]  Around 2010, she began to noticed neck discomfort, she tends to hold her neck to the right side, gradually getting worse, to the point of difficulty keeping her neck straight, tendency of head titubation, constant posterior neck pain, difficulty turning her neck to the left side, she also developed bilateral shoulder pain. She has difficulty driving, difficulty turning.  She has to hold her chin frequently to keep her neck in a straight position, she denies gait difficulty, no bowel and bladder incontinence.   She has gone through physical therapy, chiropractor, epidural injection without significant improvement   She had MRI of cervical spine at orthopedic clinic in August 2016, multilevel cervical degenerative changes, most severe at C5-6, with mild-to-moderate canal stenosis, no cord signal changes, was not able to reviewed the film  MRI of cervical spine August 2016: Moderate multilevel cervical disc degeneration with moderate<BR>spinal stenosis at C5-6.  Mild spinal stenosis at C6-7 3. Mild-to-moderate multilevel foraminal stenosis as above.  Potential side effect of xeomin was explained to the patient, this is her first EMG guided xeomin injection was in March 2017  Update February 26, 2022 Over the years, she has been receiving low-dose xeomin injection, currently 200 units every 3 months, does help her neck pain, abnormal  neck posturing   Today she also complains of right facial deep aching pain, started since August 2023, severe for 3 weeks, now with some improvement, but still have moderate right preoccupy area pain, spreading to right retro-orbital, neck, constant achy pain, grinding sound in right TMJ, examination showed crepitus sensation with right TMJ movement, and tenderness at the joint, she has pending orthodontic appointment  UPDATE June 04 2022: She fell in February 2024, had bruises on her right face, still has right upper tooth numbness, mild achy pain, increased neck muscle tension with stress, younger brother suffered bilateral carotid artery significant stenosis requiring endarterectomy, Xeomin injection works for her,  UPDATE Sept 4th 2024: She missed her scheduled injection, now complains of worsening neck pain, especially posterior left cervical region,  ASSESSMENT AND PLAN  Cervical dystonia:  She has slight anterocollis, mild left shoulder elevation, tenderness of bilateral levator scapular at scapular insertion, mild difficulty turning to left side.   Her first EMG guided xeomin injection in March 28th 2017  Used Xeomin 200units under EMG guidance    Left longissimus capitis 25 units Left splenius capitis 25 units Left levator scapula 25 unit Left upper trapezius 25 units Left iliocostalis 25 units  Right levator scapular 25 Right splenius cervix 25 Right iliocostalis 25 units   She tolerated the injection well,   Return in 3 months for repeat injections   Levert Feinstein, M.D. Ph.D.  [facility] [address], [address] [place], [place] [address] Ph: [phone] Fax: [phone]   CC:Romero Belling, MD. Dr. Lucianne Lei

## 2022-12-08 DIAGNOSIS — E1169 Type 2 diabetes mellitus with other specified complication: Secondary | ICD-10-CM | POA: Diagnosis not present

## 2022-12-08 DIAGNOSIS — R197 Diarrhea, unspecified: Secondary | ICD-10-CM | POA: Diagnosis not present

## 2022-12-08 DIAGNOSIS — R519 Headache, unspecified: Secondary | ICD-10-CM | POA: Diagnosis not present

## 2022-12-08 DIAGNOSIS — Z6826 Body mass index (BMI) 26.0-26.9, adult: Secondary | ICD-10-CM | POA: Diagnosis not present

## 2023-01-14 DIAGNOSIS — E119 Type 2 diabetes mellitus without complications: Secondary | ICD-10-CM | POA: Diagnosis not present

## 2023-03-04 ENCOUNTER — Ambulatory Visit: Payer: PPO | Admitting: Neurology

## 2023-03-04 VITALS — BP 140/79 | HR 90

## 2023-03-04 DIAGNOSIS — G243 Spasmodic torticollis: Secondary | ICD-10-CM

## 2023-03-04 MED ORDER — INCOBOTULINUMTOXINA 100 UNITS IM SOLR: 200.0000 [IU] | INTRAMUSCULAR | Status: AC

## 2023-03-04 NOTE — Progress Notes (Signed)
     PATIENT: Cassandra Decker DOB: [birth year]  Chief Complaint  Patient presents with   Cervical Dystonia    Xeomin 100 units x 1 vials - specialty pharmacy     HISTORICAL  [name] is a 77 years old right-handed female, alone at today's clinical visit, seen in refer by  Romero Belling, her primary care Dr. Andria Rhein for evaluation of neck pain, abnormal neck posture  She had a history of hyperlipidemia, polymyalgia rheumatica, has been treated with prednisone 5 mg daily for few years, borderline diabetes, she works as Print production planner for [employer]  Around 2010, she began to noticed neck discomfort, she tends to hold her neck to the right side, gradually getting worse, to the point of difficulty keeping her neck straight, tendency of head titubation, constant posterior neck pain, difficulty turning her neck to the left side, she also developed bilateral shoulder pain. She has difficulty driving, difficulty turning.  She has to hold her chin frequently to keep her neck in a straight position, she denies gait difficulty, no bowel and bladder incontinence.   She has gone through physical therapy, chiropractor, epidural injection without significant improvement   She had MRI of cervical spine at orthopedic clinic in August 2016, multilevel cervical degenerative changes, most severe at C5-6, with mild-to-moderate canal stenosis, no cord signal changes, was not able to reviewed the film  MRI of cervical spine August 2016: Moderate multilevel cervical disc degeneration with moderate<BR>spinal stenosis at C5-6.  Mild spinal stenosis at C6-7 3. Mild-to-moderate multilevel foraminal stenosis as above.  Potential side effect of xeomin was explained to the patient, this is her first EMG guided xeomin injection was in March 2017  Update February 26, 2022 Over the years, she has been receiving low-dose xeomin injection, currently 200 units every 3 months, does help her neck pain, abnormal  neck posturing   Today she also complains of right facial deep aching pain, started since August 2023, severe for 3 weeks, now with some improvement, but still have moderate right preoccupy area pain, spreading to right retro-orbital, neck, constant achy pain, grinding sound in right TMJ, examination showed crepitus sensation with right TMJ movement, and tenderness at the joint, she has pending orthodontic appointment  UPDATE June 04 2022: She fell in February 2024, had bruises on her right face, still has right upper tooth numbness, mild achy pain, increased neck muscle tension with stress, younger brother suffered bilateral carotid artery significant stenosis requiring endarterectomy, Xeomin injection works for her,  UPDATE Sept 4th 2024: She missed her scheduled injection, now complains of worsening neck pain, especially posterior left cervical region,  ASSESSMENT AND PLAN  Cervical dystonia:  She has slight anterocollis, mild left shoulder elevation, right tilt, tenderness of bilateral levator scapular at scapular insertion, mild difficulty turning to left side.   Her first EMG guided xeomin injection in March 28th 2017  Used Xeomin 200units under EMG guidance    Left longissimus capitis 25 units Left splenius capitis 25 units Left levator scapula 25 unit Left upper trapezius 25 units Left iliocostalis 25 units  Right levator scapular 25 Right splenius cervix 25 Right iliocostalis 25 units   She tolerated the injection well,   Return in 3 months for repeat injections   Levert Feinstein, M.D. Ph.D.  [facility] [address] [place], [place] [address] Ph: [phone] Fax: [phone]   CC:Romero Belling, MD. Dr. Lucianne Lei

## 2023-03-04 NOTE — Progress Notes (Signed)
xeomin 100units x 2 vial  Ndc-0259-1610-01 [id number] Exp-12/25 B/B OR s/p Bacteriostatic 0.9% Sodium Chloride- 4mL  VQQ:[id number] Expiration: 06/30/23 NDC: [id number] Dx: G24.3  WITNESSED OA:[id number] cma

## 2023-04-09 DIAGNOSIS — E785 Hyperlipidemia, unspecified: Secondary | ICD-10-CM | POA: Diagnosis not present

## 2023-04-09 DIAGNOSIS — Z6825 Body mass index (BMI) 25.0-25.9, adult: Secondary | ICD-10-CM | POA: Diagnosis not present

## 2023-04-09 DIAGNOSIS — Z79899 Other long term (current) drug therapy: Secondary | ICD-10-CM | POA: Diagnosis not present

## 2023-04-09 DIAGNOSIS — G8929 Other chronic pain: Secondary | ICD-10-CM | POA: Diagnosis not present

## 2023-04-09 DIAGNOSIS — E1169 Type 2 diabetes mellitus with other specified complication: Secondary | ICD-10-CM | POA: Diagnosis not present

## 2023-04-09 DIAGNOSIS — E559 Vitamin D deficiency, unspecified: Secondary | ICD-10-CM | POA: Diagnosis not present

## 2023-04-09 DIAGNOSIS — Z139 Encounter for screening, unspecified: Secondary | ICD-10-CM | POA: Diagnosis not present

## 2023-04-09 DIAGNOSIS — Z23 Encounter for immunization: Secondary | ICD-10-CM | POA: Diagnosis not present

## 2023-05-18 DIAGNOSIS — K219 Gastro-esophageal reflux disease without esophagitis: Secondary | ICD-10-CM | POA: Diagnosis not present

## 2023-05-18 DIAGNOSIS — E039 Hypothyroidism, unspecified: Secondary | ICD-10-CM | POA: Diagnosis not present

## 2023-05-18 DIAGNOSIS — E1169 Type 2 diabetes mellitus with other specified complication: Secondary | ICD-10-CM | POA: Diagnosis not present

## 2023-05-18 DIAGNOSIS — M19019 Primary osteoarthritis, unspecified shoulder: Secondary | ICD-10-CM | POA: Diagnosis not present

## 2023-05-18 DIAGNOSIS — G8929 Other chronic pain: Secondary | ICD-10-CM | POA: Diagnosis not present

## 2023-05-18 DIAGNOSIS — I10 Essential (primary) hypertension: Secondary | ICD-10-CM | POA: Diagnosis not present

## 2023-06-03 ENCOUNTER — Telehealth: Payer: Self-pay | Admitting: Neurology

## 2023-06-03 ENCOUNTER — Ambulatory Visit: Payer: PPO | Admitting: Neurology

## 2023-06-03 VITALS — BP 136/72 | HR 88

## 2023-06-03 DIAGNOSIS — G243 Spasmodic torticollis: Secondary | ICD-10-CM | POA: Diagnosis not present

## 2023-06-03 MED ORDER — XEOMIN 100 UNITS IM SOLR: 800.0000 [IU] | INTRAMUSCULAR | 0 refills | Status: DC

## 2023-06-03 MED ORDER — INCOBOTULINUMTOXINA 100 UNITS IM SOLR: 200.0000 [IU] | Freq: Once | INTRAMUSCULAR | Status: AC

## 2023-06-03 NOTE — Telephone Encounter (Signed)
 Refill [facility] to [facility]

## 2023-06-03 NOTE — Progress Notes (Unsigned)
 xeomin 100 units x 2 vial  Ndc-0259-1610-01 [id number] Exp-02/2024 S/P Bacteriostatic 0.9% Sodium Chloride- 4mL  ZOX:[id number] Expiration: 01/30/2024 NDC: 4098-1191-47 Dx: W29.5 WITNESSED AO:ZHYQM J RN

## 2023-06-03 NOTE — Addendum Note (Signed)
 Addended by: Danne Harbor on: 06/03/2023 04:24 PM   Modules accepted: Orders

## 2023-06-03 NOTE — Telephone Encounter (Signed)
 At checkout, pt asked if her Xeomin for her June appt was already here. I called and informed pt that we would need to fill through the pharmacy because we used her last 2 vials today.  Last year, we sent 800 units in to [facility] so patient would only have to fill once for the year as she has a high copay. Please send rx in, thank you!

## 2023-06-03 NOTE — Progress Notes (Unsigned)
     PATIENT: Cassandra Decker DOB: [birth year]  Chief Complaint  Patient presents with   Cervical Dystonia    Xeomin 100 units x 1 vials - specialty pharmacy     HISTORICAL  Cassandra Decker is a 78 years old right-handed female, alone at today's clinical visit, seen in refer by  Cassandra Decker, her primary care Dr. Andria Decker for evaluation of neck pain, abnormal neck posture  She had a history of hyperlipidemia, polymyalgia rheumatica, has been treated with prednisone 5 mg daily for few years, borderline diabetes, she works as Print production planner for [employer]  Around 2010, she began to noticed neck discomfort, she tends to hold her neck to the right side, gradually getting worse, to the point of difficulty keeping her neck straight, tendency of head titubation, constant posterior neck pain, difficulty turning her neck to the left side, she also developed bilateral shoulder pain. She has difficulty driving, difficulty turning.  She has to hold her chin frequently to keep her neck in a straight position, she denies gait difficulty, no bowel and bladder incontinence.   She has gone through physical therapy, chiropractor, epidural injection without significant improvement   She had MRI of cervical spine at orthopedic clinic in August 2016, multilevel cervical degenerative changes, most severe at C5-6, with mild-to-moderate canal stenosis, no cord signal changes, was not able to reviewed the film  MRI of cervical spine August 2016: Moderate multilevel cervical disc degeneration with moderate<BR>spinal stenosis at C5-6.  Mild spinal stenosis at C6-7 3. Mild-to-moderate multilevel foraminal stenosis as above.  Potential side effect of xeomin was explained to the patient, this is her first EMG guided xeomin injection was in March 2017  Update February 26, 2022 Over the years, she has been receiving low-dose xeomin injection, currently 200 units every 3 months, does help her neck pain, abnormal  neck posturing   Today she also complains of right facial deep aching pain, started since August 2023, severe for 3 weeks, now with some improvement, but still have moderate right preoccupy area pain, spreading to right retro-orbital, neck, constant achy pain, grinding sound in right TMJ, examination showed crepitus sensation with right TMJ movement, and tenderness at the joint, she has pending orthodontic appointment  UPDATE June 04 2022: She fell in February 2024, had bruises on her right face, still has right upper tooth numbness, mild achy pain, increased neck muscle tension with stress, younger brother suffered bilateral carotid artery significant stenosis requiring endarterectomy, Xeomin injection works for her,  UPDATE Sept 4th 2024: She missed her scheduled injection, now complains of worsening neck pain, especially posterior left cervical region,  ASSESSMENT AND PLAN  Cervical dystonia:  She has slight anterocollis, mild left shoulder elevation, right tilt, tenderness of bilateral levator scapular at scapular insertion, mild difficulty turning to left side.   Her first EMG guided xeomin injection in March 28th 2017  Used Xeomin 200units under EMG guidance    Left longissimus capitis 25 units Left splenius capitis 25 units Left levator scapula 25 unit Left upper trapezius 25 units Left iliocostalis 25 units  Right levator scapular 25 Right splenius cervix 25 Right iliocostalis 25 units   She tolerated the injection well,   Return in 3 months for repeat injections   Cassandra Decker, M.D. Ph.D.  [facility] [address] [place], [place] [address] Ph: [phone] Fax: [phone]   CC:Cassandra Belling, MD. Dr. Lucianne Lei

## 2023-06-05 DIAGNOSIS — M19011 Primary osteoarthritis, right shoulder: Secondary | ICD-10-CM | POA: Diagnosis not present

## 2023-06-05 DIAGNOSIS — M19012 Primary osteoarthritis, left shoulder: Secondary | ICD-10-CM | POA: Diagnosis not present

## 2023-06-25 DIAGNOSIS — G8929 Other chronic pain: Secondary | ICD-10-CM | POA: Diagnosis not present

## 2023-06-25 DIAGNOSIS — R61 Generalized hyperhidrosis: Secondary | ICD-10-CM | POA: Diagnosis not present

## 2023-07-29 DIAGNOSIS — M19011 Primary osteoarthritis, right shoulder: Secondary | ICD-10-CM | POA: Diagnosis not present

## 2023-07-29 DIAGNOSIS — M19012 Primary osteoarthritis, left shoulder: Secondary | ICD-10-CM | POA: Diagnosis not present

## 2023-07-30 DIAGNOSIS — E785 Hyperlipidemia, unspecified: Secondary | ICD-10-CM | POA: Diagnosis not present

## 2023-07-30 DIAGNOSIS — E039 Hypothyroidism, unspecified: Secondary | ICD-10-CM | POA: Diagnosis not present

## 2023-07-30 DIAGNOSIS — E1169 Type 2 diabetes mellitus with other specified complication: Secondary | ICD-10-CM | POA: Diagnosis not present

## 2023-07-30 DIAGNOSIS — K219 Gastro-esophageal reflux disease without esophagitis: Secondary | ICD-10-CM | POA: Diagnosis not present

## 2023-07-30 DIAGNOSIS — I10 Essential (primary) hypertension: Secondary | ICD-10-CM | POA: Diagnosis not present

## 2023-07-30 DIAGNOSIS — E559 Vitamin D deficiency, unspecified: Secondary | ICD-10-CM | POA: Diagnosis not present

## 2023-07-30 DIAGNOSIS — F330 Major depressive disorder, recurrent, mild: Secondary | ICD-10-CM | POA: Diagnosis not present

## 2023-07-30 DIAGNOSIS — G8929 Other chronic pain: Secondary | ICD-10-CM | POA: Diagnosis not present

## 2023-07-30 DIAGNOSIS — M353 Polymyalgia rheumatica: Secondary | ICD-10-CM | POA: Diagnosis not present

## 2023-08-12 DIAGNOSIS — M25411 Effusion, right shoulder: Secondary | ICD-10-CM | POA: Diagnosis not present

## 2023-08-12 DIAGNOSIS — M75101 Unspecified rotator cuff tear or rupture of right shoulder, not specified as traumatic: Secondary | ICD-10-CM | POA: Diagnosis not present

## 2023-08-12 DIAGNOSIS — M19011 Primary osteoarthritis, right shoulder: Secondary | ICD-10-CM | POA: Diagnosis not present

## 2023-08-13 DIAGNOSIS — M75121 Complete rotator cuff tear or rupture of right shoulder, not specified as traumatic: Secondary | ICD-10-CM | POA: Diagnosis not present

## 2023-08-19 DIAGNOSIS — M19211 Secondary osteoarthritis, right shoulder: Secondary | ICD-10-CM | POA: Diagnosis not present

## 2023-08-19 NOTE — Telephone Encounter (Signed)
 Called pt to remind her that we do need to refill her Xeomin  for her next appt. Provided [facility] # and pt states she will call.

## 2023-09-03 ENCOUNTER — Ambulatory Visit: Admitting: Neurology

## 2023-09-03 ENCOUNTER — Telehealth: Payer: Self-pay | Admitting: Neurology

## 2023-09-03 ENCOUNTER — Encounter: Payer: Self-pay | Admitting: Neurology

## 2023-09-03 VITALS — BP 147/75 | HR 94 | Ht 63.0 in | Wt 153.0 lb

## 2023-09-03 DIAGNOSIS — G243 Spasmodic torticollis: Secondary | ICD-10-CM | POA: Diagnosis not present

## 2023-09-03 MED ORDER — INCOBOTULINUMTOXINA 100 UNITS IM SOLR: 200.0000 [IU] | INTRAMUSCULAR | Status: AC

## 2023-09-03 NOTE — Telephone Encounter (Signed)
 We received 800 units for her yesterday, she is good for the year.

## 2023-09-03 NOTE — Progress Notes (Signed)
 PATIENT: Cassandra Decker DOB: 1946-02-27  Chief Complaint  Patient presents with   Cervical Dystonia    Xeomin  100 units x 1 vials - specialty pharmacy     HISTORICAL  Cassandra Decker is a 78 years old right-handed female, alone at today's clinical visit, seen in refer by  [place] Ibazebo, her primary care Dr. Kathrynn Park for evaluation of neck pain, abnormal neck posture  She had a history of hyperlipidemia, polymyalgia rheumatica, has been treated with prednisone 5 mg daily for few years, borderline diabetes, she works as Print production planner for [employer]  Around 2010, she began to noticed neck discomfort, she tends to hold her neck to the right side, gradually getting worse, to the point of difficulty keeping her neck straight, tendency of head titubation, constant posterior neck pain, difficulty turning her neck to the left side, she also developed bilateral shoulder pain. She has difficulty driving, difficulty turning.  She has to hold her chin frequently to keep her neck in a straight position, she denies gait difficulty, no bowel and bladder incontinence.   She has gone through physical therapy, chiropractor, epidural injection without significant improvement   She had MRI of cervical spine at orthopedic clinic in August 2016, multilevel cervical degenerative changes, most severe at C5-6, with mild-to-moderate canal stenosis, no cord signal changes, was not able to reviewed the film  MRI of cervical spine August 2016: Moderate multilevel cervical disc degeneration with moderate<BR>spinal stenosis at C5-6.  Mild spinal stenosis at C6-7 3. Mild-to-moderate multilevel foraminal stenosis as above.  Potential side effect of xeomin  was explained to the patient, this is her first EMG guided xeomin  injection was in March 2017  Update February 26, 2022 Over the years, she has been receiving low-dose xeomin  injection, currently 200 units every 3 months, does help her neck pain, abnormal  neck posturing   Today she also complains of right facial deep aching pain, started since August 2023, severe for 3 weeks, now with some improvement, but still have moderate right preoccupy area pain, spreading to right retro-orbital, neck, constant achy pain, grinding sound in right TMJ, examination showed crepitus sensation with right TMJ movement, and tenderness at the joint, she has pending orthodontic appointment  UPDATE June 04 2022: She fell in February 2024, had bruises on her right face, still has right upper tooth numbness, mild achy pain, increased neck muscle tension with stress, younger brother suffered bilateral carotid artery significant stenosis requiring endarterectomy, Xeomin  injection works for her,  UPDATE Sept 4th 2024: She missed her scheduled injection, now complains of worsening neck pain, especially posterior left cervical region,  UPDATE June 5th 2025: She has right shoulder pain, neck pain, difficulty turning neck to left side.  ASSESSMENT AND PLAN  Cervical dystonia:  She has slight anterocollis, mild left shoulder elevation, right tilt, tenderness of bilateral levator scapular at scapular insertion, mild difficulty turning to left side.   Her first EMG guided xeomin  injection in March 28th 2017  Used Xeomin  200units under EMG guidance     Left levator scapula 25 unit Left upper trapezius 25 units Left iliocostalis 25 units Left splenius cervix 25 units  Right levator scapular 25 Right splenius cervix 25 Right iliocostalis 25 units  Right upper trapezius 25 units  She tolerated the injection well,   Return in 3 months for repeat injections   Usher Hedberg, M.D. Ph.D.  [facility] [address] [place], [place] [address] Ph: [phone] Fax: [phone]  [phone]   CC:[place]mead Ibazebo, MD. Dr. Tamela Fake

## 2023-09-03 NOTE — Progress Notes (Signed)
 xeomin  100units x 2 vial  [id number] Exp-2027/09  s/p Bacteriostatic 0.9% Sodium Chloride- 4mL  FAO:[id number] Expiration: 09/27/24 NDC: [id number] Dx: Terrel Ferries  WITNESSED WU:[id number] RN

## 2023-09-03 NOTE — Telephone Encounter (Signed)
 Patient is getting Xeomin  200 units every 3 months through her Special[facility] pharmacy,  Can we order 12 months supply in one order, so she does not have to worry about that part, still come in every 3 months for injection

## 2023-09-17 DIAGNOSIS — Z6826 Body mass index (BMI) 26.0-26.9, adult: Secondary | ICD-10-CM | POA: Diagnosis not present

## 2023-09-17 DIAGNOSIS — R1310 Dysphagia, unspecified: Secondary | ICD-10-CM | POA: Diagnosis not present

## 2023-09-17 DIAGNOSIS — M2550 Pain in unspecified joint: Secondary | ICD-10-CM | POA: Diagnosis not present

## 2023-09-17 DIAGNOSIS — R197 Diarrhea, unspecified: Secondary | ICD-10-CM | POA: Diagnosis not present

## 2023-09-17 DIAGNOSIS — R059 Cough, unspecified: Secondary | ICD-10-CM | POA: Diagnosis not present

## 2023-09-22 DIAGNOSIS — R197 Diarrhea, unspecified: Secondary | ICD-10-CM | POA: Diagnosis not present

## 2023-09-25 ENCOUNTER — Encounter: Payer: Self-pay | Admitting: Gastroenterology

## 2023-10-21 DIAGNOSIS — E785 Hyperlipidemia, unspecified: Secondary | ICD-10-CM | POA: Diagnosis not present

## 2023-10-21 DIAGNOSIS — E1169 Type 2 diabetes mellitus with other specified complication: Secondary | ICD-10-CM | POA: Diagnosis not present

## 2023-10-21 DIAGNOSIS — E559 Vitamin D deficiency, unspecified: Secondary | ICD-10-CM | POA: Diagnosis not present

## 2023-10-21 DIAGNOSIS — J45909 Unspecified asthma, uncomplicated: Secondary | ICD-10-CM | POA: Diagnosis not present

## 2023-10-21 DIAGNOSIS — M129 Arthropathy, unspecified: Secondary | ICD-10-CM | POA: Diagnosis not present

## 2023-10-26 DIAGNOSIS — E1169 Type 2 diabetes mellitus with other specified complication: Secondary | ICD-10-CM | POA: Diagnosis not present

## 2023-10-26 DIAGNOSIS — E559 Vitamin D deficiency, unspecified: Secondary | ICD-10-CM | POA: Diagnosis not present

## 2023-10-26 DIAGNOSIS — E785 Hyperlipidemia, unspecified: Secondary | ICD-10-CM | POA: Diagnosis not present

## 2023-11-03 ENCOUNTER — Telehealth: Payer: Self-pay

## 2023-11-03 DIAGNOSIS — G243 Spasmodic torticollis: Secondary | ICD-10-CM

## 2023-11-03 MED ORDER — XEOMIN 200 UNITS IM SOLR: 200.0000 [IU] | INTRAMUSCULAR | 2 refills | Status: DC

## 2023-11-12 NOTE — Telephone Encounter (Signed)
 Medication is  incobotulinumtoxinA (XEOMIN) 100 units injection 100 Units incobotulinumtoxinA (XEOMIN) 100 units injection 200 Units incobotulinumtoxinA (XEOMIN) 100 units injection 200 Units incobotulinumtoxinA (XEOMIN) 100 units injection 200 Units incobotulinumtoxinA (XEOMIN) 100 units injection 200 Units

## 2023-11-12 NOTE — Telephone Encounter (Signed)
 Based on previous note the pt should be good for the year. Please let the pt know:

## 2023-11-12 NOTE — Telephone Encounter (Signed)
 Pharmacy called to follow up about Pt refill request . Pharmacy stated they still have not received pt order , Rep informed MD can call to give verbal or fax over order    [facility] [place], [place] - [address]  Phone# [phone] Fax# [phone]

## 2023-11-12 NOTE — Telephone Encounter (Signed)
 Phone room: please clarify what medication they need refilled

## 2023-11-16 NOTE — Telephone Encounter (Signed)
 The message from CMA was relayed to pt

## 2023-11-16 NOTE — Telephone Encounter (Signed)
 Called and LVM for pt to call the office to inform her of this. Left office phone number in VM.

## 2023-11-16 NOTE — Telephone Encounter (Signed)
 Pt has returned call to [facility]

## 2023-11-17 ENCOUNTER — Ambulatory Visit: Admitting: Gastroenterology

## 2023-11-23 DIAGNOSIS — M19211 Secondary osteoarthritis, right shoulder: Secondary | ICD-10-CM | POA: Diagnosis not present

## 2023-11-26 ENCOUNTER — Ambulatory Visit: Admitting: Neurology

## 2023-12-02 DIAGNOSIS — I10 Essential (primary) hypertension: Secondary | ICD-10-CM | POA: Diagnosis not present

## 2023-12-02 DIAGNOSIS — G8929 Other chronic pain: Secondary | ICD-10-CM | POA: Diagnosis not present

## 2023-12-02 DIAGNOSIS — R748 Abnormal levels of other serum enzymes: Secondary | ICD-10-CM | POA: Diagnosis not present

## 2023-12-02 DIAGNOSIS — F330 Major depressive disorder, recurrent, mild: Secondary | ICD-10-CM | POA: Diagnosis not present

## 2023-12-02 DIAGNOSIS — Z6824 Body mass index (BMI) 24.0-24.9, adult: Secondary | ICD-10-CM | POA: Diagnosis not present

## 2023-12-02 DIAGNOSIS — M2550 Pain in unspecified joint: Secondary | ICD-10-CM | POA: Diagnosis not present

## 2023-12-02 DIAGNOSIS — K219 Gastro-esophageal reflux disease without esophagitis: Secondary | ICD-10-CM | POA: Diagnosis not present

## 2023-12-03 DIAGNOSIS — M2550 Pain in unspecified joint: Secondary | ICD-10-CM | POA: Diagnosis not present

## 2023-12-07 ENCOUNTER — Ambulatory Visit: Admitting: Neurology

## 2023-12-14 DIAGNOSIS — S51812A Laceration without foreign body of left forearm, initial encounter: Secondary | ICD-10-CM | POA: Diagnosis not present

## 2023-12-18 ENCOUNTER — Telehealth: Payer: Self-pay | Admitting: Neurology

## 2023-12-18 NOTE — Telephone Encounter (Signed)
 Pharamcy called to request medication Refill. Pharamcy states that Pt is Out of refill . I seenote back in August about PT getting medication for a year . Pharmacy would like to Follow up with MD / Nurse   incobotulinumtoxinA  (XEOMIN ) 100 units injection 100 Units incobotulinumtoxinA  (XEOMIN ) 100 units injection 200 Units incobotulinumtoxinA  (XEOMIN ) 100 units injection 200 Units incobotulinumtoxinA  (XEOMIN ) 100 units injection 200 Units incobotulinumtoxinA  (XEOMIN ) 100 units injection 200 Units  Pt med [facility]) [place]  - [place], [place] - [address] [facility] [address] Phone: [phone]  Fax: [phone]    ication is to be sent to

## 2023-12-21 MED ORDER — XEOMIN 200 UNITS IM SOLR: 200.0000 [IU] | INTRAMUSCULAR | 2 refills | Status: DC

## 2023-12-21 NOTE — Telephone Encounter (Signed)
 refilled

## 2023-12-21 NOTE — Telephone Encounter (Signed)
 Returned call to [facility] and stated that we will reach out to them when we use that. They voiced understanding.

## 2023-12-21 NOTE — Telephone Encounter (Signed)
 Jillian,  I Believe this was he one you didn't need anything on because you have extra for the whole year can you clarify and contact the pharmacy to explain? Thanks,  Production assistant, radio

## 2023-12-21 NOTE — Addendum Note (Signed)
 Addended by: ONEITA NEVELYN BRAVO on: 12/21/2023 07:39 AM   Modules accepted: Orders
